# Patient Record
Sex: Male | Born: 1970 | Race: White | Hispanic: No | Marital: Married | Smoking: Never smoker
Health system: Southern US, Community
[De-identification: ages and names within clinical notes are randomized; demographics above are authoritative.]

## PROBLEM LIST (undated history)

## (undated) DIAGNOSIS — I10 Essential (primary) hypertension: Secondary | ICD-10-CM

## (undated) DIAGNOSIS — R7989 Other specified abnormal findings of blood chemistry: Secondary | ICD-10-CM

## (undated) DIAGNOSIS — E291 Testicular hypofunction: Secondary | ICD-10-CM

## (undated) DIAGNOSIS — R7303 Prediabetes: Secondary | ICD-10-CM

## (undated) HISTORY — PX: NASAL SEPTUM SURGERY: SHX37

## (undated) HISTORY — DX: Essential (primary) hypertension: I10

## (undated) HISTORY — DX: Prediabetes: R73.03

## (undated) HISTORY — DX: Testicular hypofunction: E29.1

---

## 2002-03-06 ENCOUNTER — Encounter: Payer: Self-pay | Admitting: Emergency Medicine

## 2002-03-06 ENCOUNTER — Encounter: Payer: Self-pay | Admitting: *Deleted

## 2002-03-06 ENCOUNTER — Emergency Department (HOSPITAL_COMMUNITY): Admission: EM | Admit: 2002-03-06 | Discharge: 2002-03-06 | Payer: Self-pay | Admitting: Emergency Medicine

## 2005-07-09 ENCOUNTER — Emergency Department (HOSPITAL_COMMUNITY): Admission: EM | Admit: 2005-07-09 | Discharge: 2005-07-09 | Payer: Self-pay | Admitting: Emergency Medicine

## 2008-05-02 ENCOUNTER — Emergency Department (HOSPITAL_COMMUNITY): Admission: EM | Admit: 2008-05-02 | Discharge: 2008-05-02 | Payer: Self-pay | Admitting: Emergency Medicine

## 2009-12-24 IMAGING — CT CT ABDOMEN W/O CM
1 of 2 series · 15 of 32 positions shown, 19 images · non-contrast
Comparison: None

CT ABDOMEN

CLINICAL DATA: Right-sided abdominal, pelvic, and flank pain with
microscopic hematuria.

CT ABDOMEN AND PELVIS WITHOUT CONTRAST
TECHNIQUE: Multidetector CT imaging of the abdomen and pelvis was
performed following the standard protocol without intravenous
contrast.

[Series 2: >200 +reformat 5.0 b31f st · axial · 0.62mm/px · z∈[-379,-24]mm · 15 of 77 slices shown, 19 images]
[im 3/77  soft-tissue]
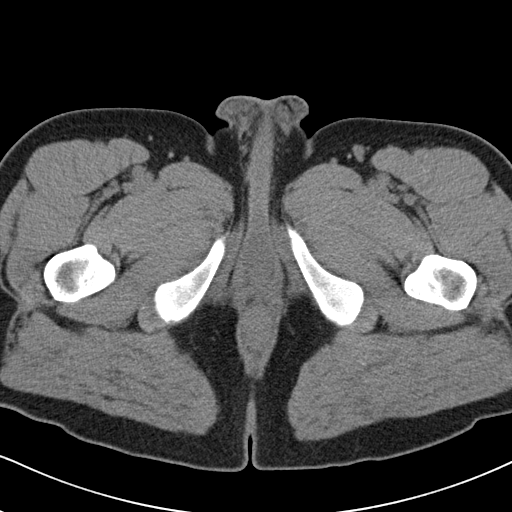
[im 3/77  bone]
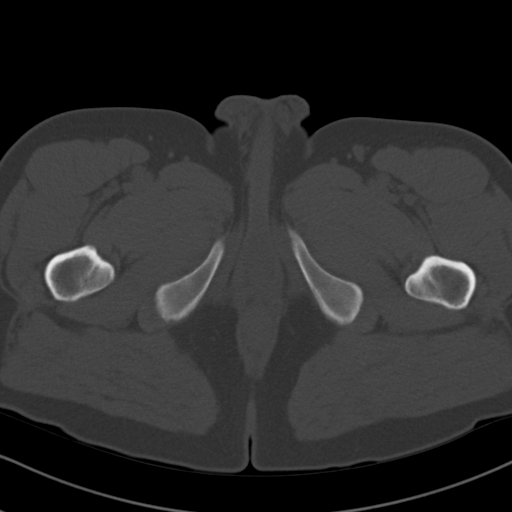
[im 9/77  soft-tissue]
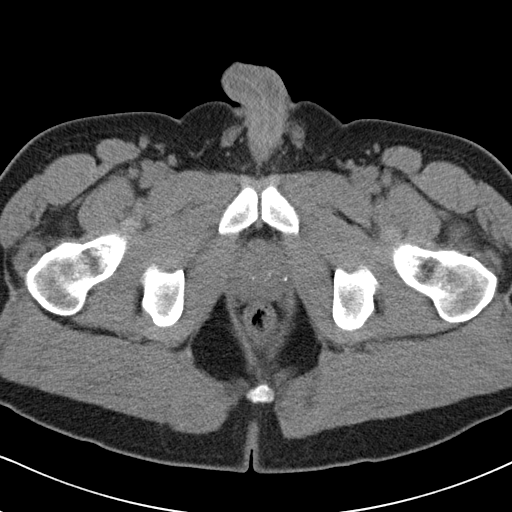
[im 15/77  soft-tissue]
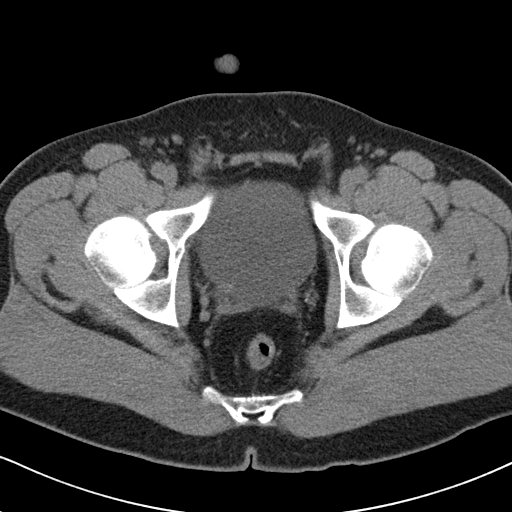
[im 21/77  soft-tissue]
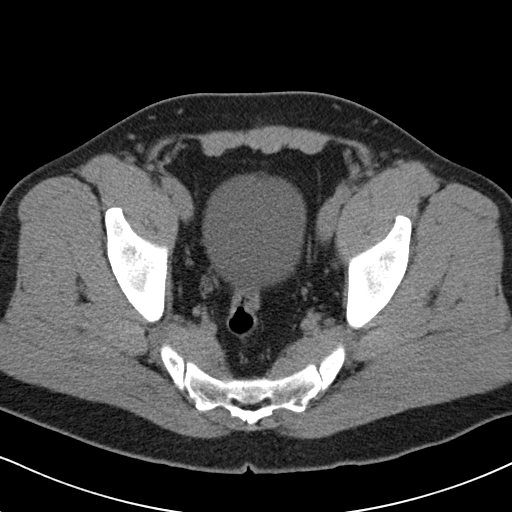
[im 27/77  soft-tissue]
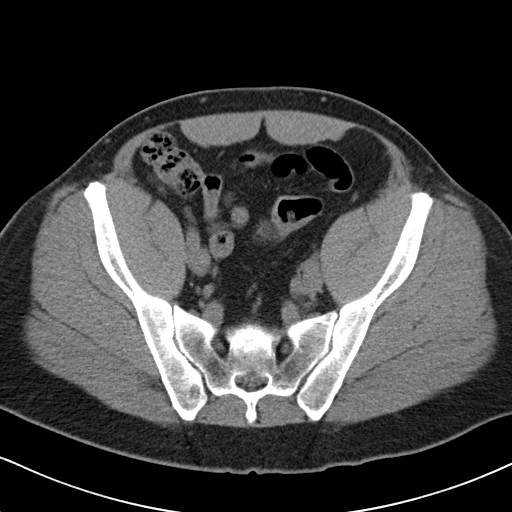
[im 33/77  soft-tissue]
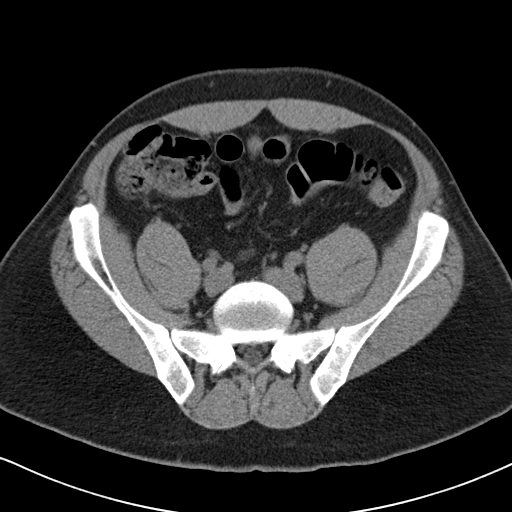
[im 39/77  soft-tissue]
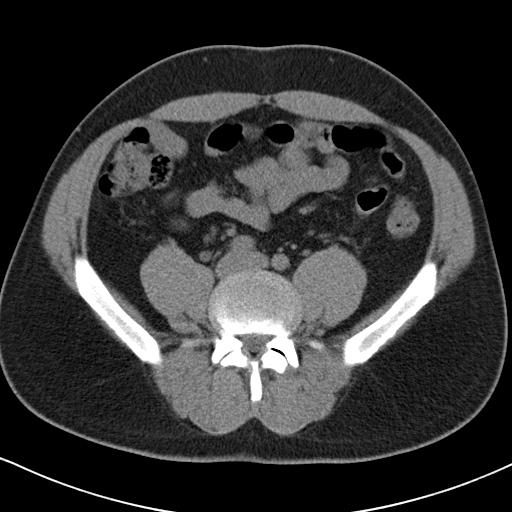
[im 44/77  soft-tissue]
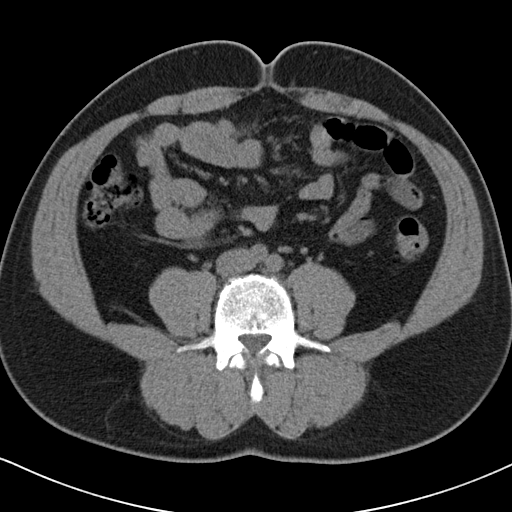
[im 50/77  soft-tissue]
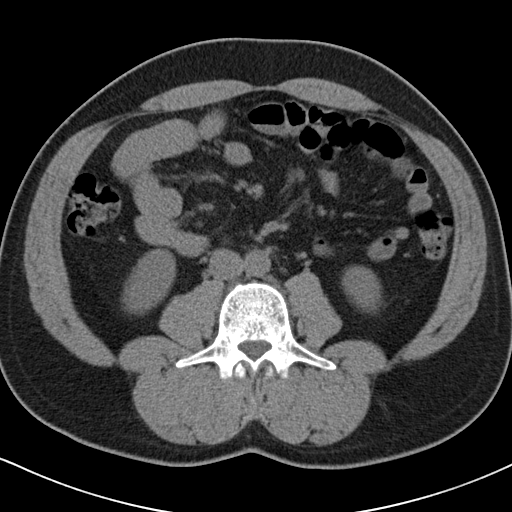
[im 50/77  bone]
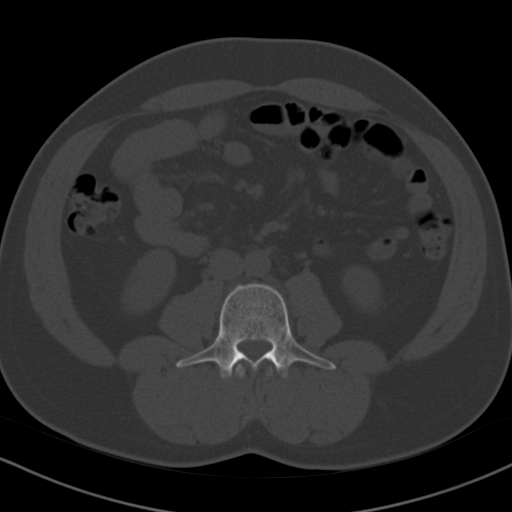
[im 56/77  soft-tissue]
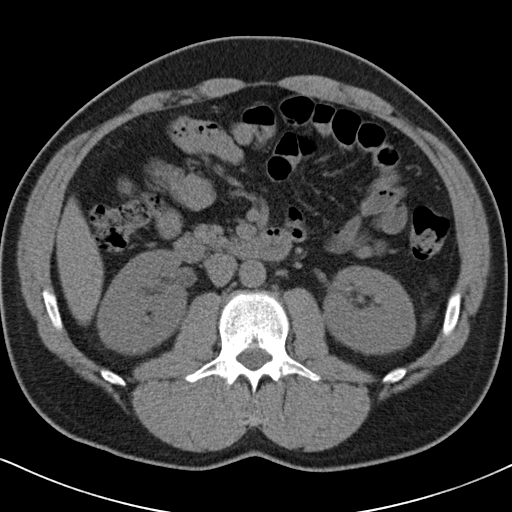
[im 62/77  soft-tissue]
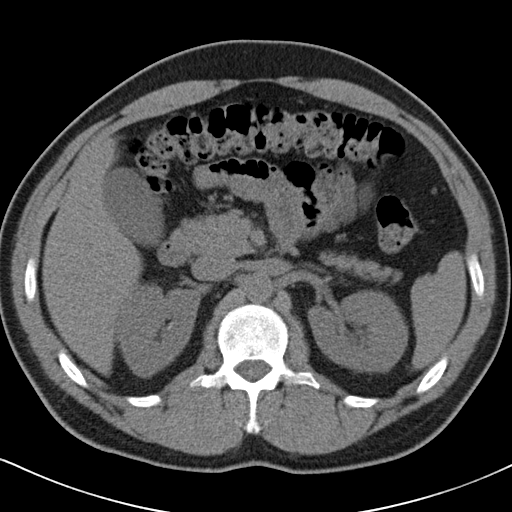
[im 65/77  lung]
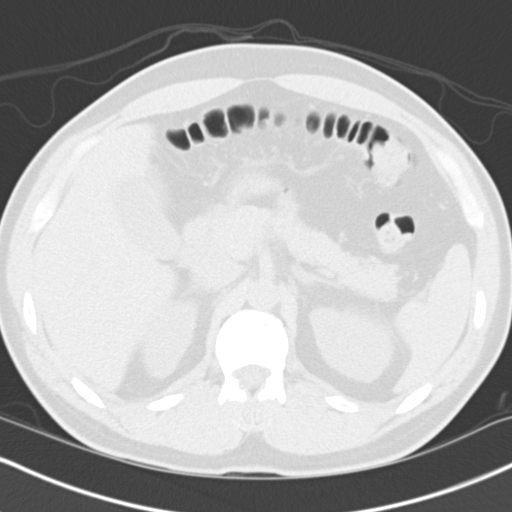
[im 68/77  soft-tissue]
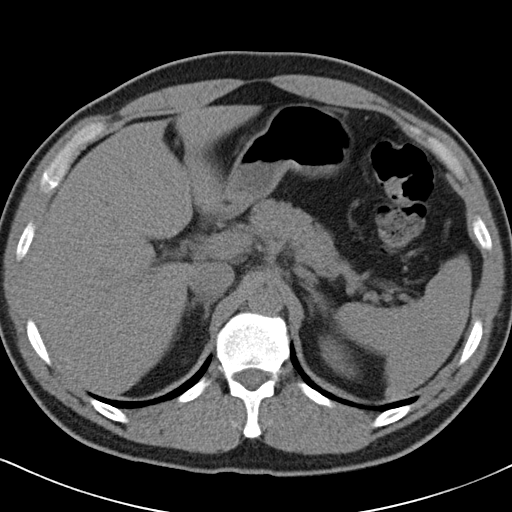
[im 68/77  lung]
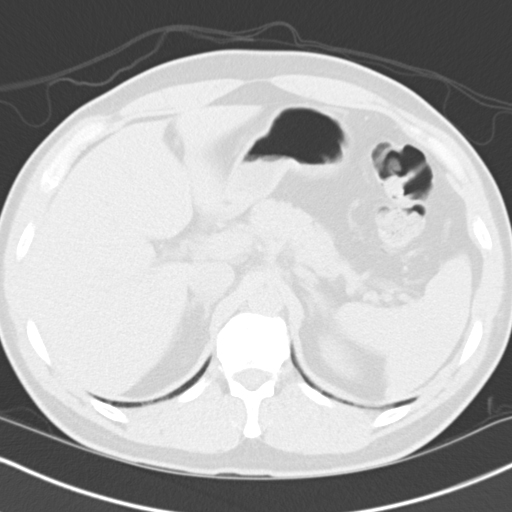
[im 71/77  lung]
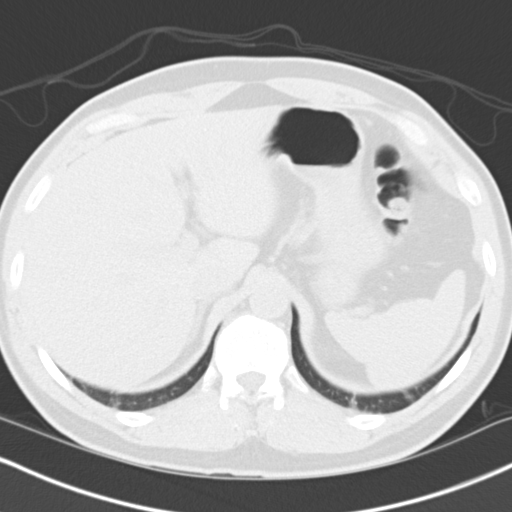
[im 74/77  soft-tissue]
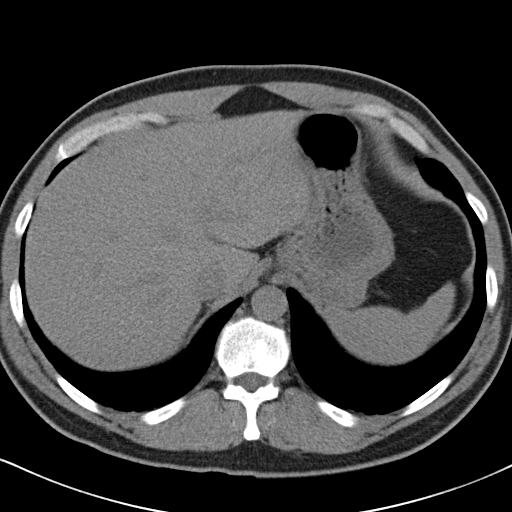
[im 74/77  lung]
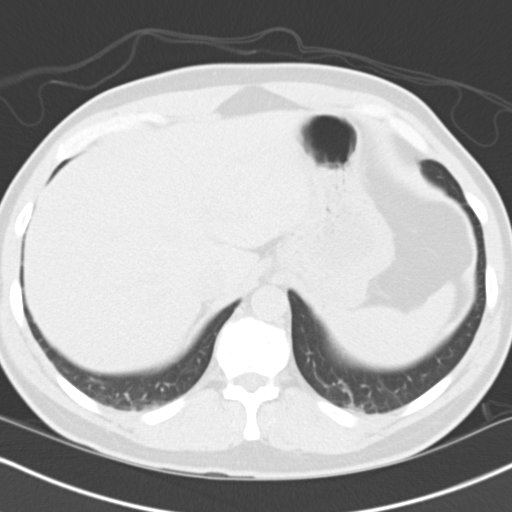

[15 of 32 positions shown; findings below may reference images not displayed]

FINDINGS: The visualized portions of the liver and spleen are
unremarkable.
The pancreas, gallbladder, and adrenal glands are unremarkable.
Nonobstructing bilateral renal calculi are identified, the largest
measuring 4 mm in the mid left kidney. There is no evidence of
hydronephrosis.
Please note that parenchymal abnormalities may be missed as
intravenous contrast was not administered.
No free fluid, enlarged lymph nodes, biliary dilation or abdominal
aortic aneurysm identified.
The visualized bowel is unremarkable.
No acute or suspicious bony abnormalities are identified.
IMPRESSION: No evidence of acute abnormality within the abdomen.

Nonobstructing bilateral renal calculi.

CT PELVIS
FINDINGS: A 2 mm calculus at the right UVJ is identified with mild
fullness of the right ureter.
The bowel and appendix are unremarkable.
There is no evidence of free fluid or enlarged lymph nodes.
No acute or suspicious bony abnormalities are identified.
IMPRESSION: 2 mm right UVJ calculus with mild right ureteral fullness.

## 2010-07-27 LAB — URINALYSIS, ROUTINE W REFLEX MICROSCOPIC
Bilirubin Urine: NEGATIVE
Glucose, UA: NEGATIVE mg/dL
Ketones, ur: 15 mg/dL — AB
Nitrite: NEGATIVE
Protein, ur: NEGATIVE mg/dL
Specific Gravity, Urine: 1.024 (ref 1.005–1.030)
Urobilinogen, UA: 0.2 mg/dL (ref 0.0–1.0)
pH: 7.5 (ref 5.0–8.0)

## 2010-07-27 LAB — URINE MICROSCOPIC-ADD ON

## 2013-04-30 ENCOUNTER — Other Ambulatory Visit: Payer: BC Managed Care – PPO

## 2013-04-30 DIAGNOSIS — Z0000 Encounter for general adult medical examination without abnormal findings: Secondary | ICD-10-CM

## 2013-04-30 LAB — CBC WITH DIFFERENTIAL/PLATELET
BASOS PCT: 0 % (ref 0–1)
Basophils Absolute: 0 10*3/uL (ref 0.0–0.1)
EOS ABS: 0.1 10*3/uL (ref 0.0–0.7)
Eosinophils Relative: 2 % (ref 0–5)
HCT: 43.8 % (ref 39.0–52.0)
Hemoglobin: 15 g/dL (ref 13.0–17.0)
Lymphocytes Relative: 30 % (ref 12–46)
Lymphs Abs: 2.2 10*3/uL (ref 0.7–4.0)
MCH: 30.2 pg (ref 26.0–34.0)
MCHC: 34.2 g/dL (ref 30.0–36.0)
MCV: 88.3 fL (ref 78.0–100.0)
Monocytes Absolute: 0.7 10*3/uL (ref 0.1–1.0)
Monocytes Relative: 10 % (ref 3–12)
NEUTROS ABS: 4.3 10*3/uL (ref 1.7–7.7)
NEUTROS PCT: 58 % (ref 43–77)
PLATELETS: 268 10*3/uL (ref 150–400)
RBC: 4.96 MIL/uL (ref 4.22–5.81)
RDW: 13.5 % (ref 11.5–15.5)
WBC: 7.5 10*3/uL (ref 4.0–10.5)

## 2013-04-30 LAB — COMPREHENSIVE METABOLIC PANEL
ALBUMIN: 4.2 g/dL (ref 3.5–5.2)
ALK PHOS: 65 U/L (ref 39–117)
ALT: 14 U/L (ref 0–53)
AST: 15 U/L (ref 0–37)
BILIRUBIN TOTAL: 0.5 mg/dL (ref 0.3–1.2)
BUN: 18 mg/dL (ref 6–23)
CO2: 26 mEq/L (ref 19–32)
Calcium: 9.3 mg/dL (ref 8.4–10.5)
Chloride: 105 mEq/L (ref 96–112)
Creat: 0.83 mg/dL (ref 0.50–1.35)
Glucose, Bld: 112 mg/dL — ABNORMAL HIGH (ref 70–99)
POTASSIUM: 4.6 meq/L (ref 3.5–5.3)
SODIUM: 141 meq/L (ref 135–145)
TOTAL PROTEIN: 6.6 g/dL (ref 6.0–8.3)

## 2013-04-30 LAB — LIPID PANEL
Cholesterol: 175 mg/dL (ref 0–200)
HDL: 47 mg/dL (ref >39)
LDL CALC: 111 mg/dL — AB (ref 0–99)
TRIGLYCERIDES: 85 mg/dL (ref <150)
Total CHOL/HDL Ratio: 3.7 Ratio
VLDL: 17 mg/dL (ref 0–40)

## 2013-05-01 ENCOUNTER — Encounter: Payer: Self-pay | Admitting: Family Medicine

## 2013-05-01 ENCOUNTER — Ambulatory Visit (INDEPENDENT_AMBULATORY_CARE_PROVIDER_SITE_OTHER): Payer: BC Managed Care – PPO | Admitting: Family Medicine

## 2013-05-01 VITALS — BP 130/88 | HR 72 | Temp 98.5°F | Resp 16 | Ht 65.0 in | Wt 183.0 lb

## 2013-05-01 DIAGNOSIS — R5383 Other fatigue: Secondary | ICD-10-CM

## 2013-05-01 DIAGNOSIS — R7303 Prediabetes: Secondary | ICD-10-CM | POA: Insufficient documentation

## 2013-05-01 DIAGNOSIS — R5381 Other malaise: Secondary | ICD-10-CM

## 2013-05-01 DIAGNOSIS — Z0000 Encounter for general adult medical examination without abnormal findings: Secondary | ICD-10-CM

## 2013-05-01 NOTE — Progress Notes (Signed)
Subjective:    Patient ID: Nathaniel Parrish, male    DOB: [birth year], 43 y.o.   MRN: [id number]  HPI Patient is here today for complete physical exam. He is also concerned because he is developed increasing daily fatigue. He denies any depression or anhedonia.  However it he has daily malaise, poor energy, low libido, erectile dysfunction. He is concerned he may have low testosterone. He came earlier for his lab work and it is listed below: Lab on 04/30/2013  Component Date Value Range Status  . WBC 04/30/2013 7.5  4.0 - 10.5 K/uL Final  . RBC 04/30/2013 4.96  4.22 - 5.81 MIL/uL Final  . Hemoglobin 04/30/2013 15.0  13.0 - 17.0 g/dL Final  . HCT 04/30/2013 43.8  39.0 - 52.0 % Final  . MCV 04/30/2013 88.3  78.0 - 100.0 fL Final  . MCH 04/30/2013 30.2  26.0 - 34.0 pg Final  . MCHC 04/30/2013 34.2  30.0 - 36.0 g/dL Final  . RDW 04/30/2013 13.5  11.5 - 15.5 % Final  . Platelets 04/30/2013 268  150 - 400 K/uL Final  . Neutrophils Relative % 04/30/2013 58  43 - 77 % Final  . Neutro Abs 04/30/2013 4.3  1.7 - 7.7 K/uL Final  . Lymphocytes Relative 04/30/2013 30  12 - 46 % Final  . Lymphs Abs 04/30/2013 2.2  0.7 - 4.0 K/uL Final  . Monocytes Relative 04/30/2013 10  3 - 12 % Final  . Monocytes Absolute 04/30/2013 0.7  0.1 - 1.0 K/uL Final  . Eosinophils Relative 04/30/2013 2  0 - 5 % Final  . Eosinophils Absolute 04/30/2013 0.1  0.0 - 0.7 K/uL Final  . Basophils Relative 04/30/2013 0  0 - 1 % Final  . Basophils Absolute 04/30/2013 0.0  0.0 - 0.1 K/uL Final  . Smear Review 04/30/2013 Criteria for review not met   Final  . Sodium 04/30/2013 141  135 - 145 mEq/L Final  . Potassium 04/30/2013 4.6  3.5 - 5.3 mEq/L Final  . Chloride 04/30/2013 105  96 - 112 mEq/L Final  . CO2 04/30/2013 26  19 - 32 mEq/L Final  . Glucose, Bld 04/30/2013 112* 70 - 99 mg/dL Final  . BUN 04/30/2013 18  6 - 23 mg/dL Final  . Creat 04/30/2013 0.83  0.50 - 1.35 mg/dL Final  . Total Bilirubin 04/30/2013 0.5  0.3 - 1.2  mg/dL Final  . Alkaline Phosphatase 04/30/2013 65  39 - 117 U/L Final  . AST 04/30/2013 15  0 - 37 U/L Final  . ALT 04/30/2013 14  0 - 53 U/L Final  . Total Protein 04/30/2013 6.6  6.0 - 8.3 g/dL Final  . Albumin 04/30/2013 4.2  3.5 - 5.2 g/dL Final  . Calcium 04/30/2013 9.3  8.4 - 10.5 mg/dL Final  . Cholesterol 04/30/2013 175  0 - 200 mg/dL Final   Comment: ATP III Classification:                                < 200        mg/dL        Desirable                               200 - 239     mg/dL        Borderline High                               >=  240        mg/dL        High                             . Triglycerides 04/30/2013 85  <150 mg/dL Final  . HDL 04/30/2013 47  >39 mg/dL Final  . Total CHOL/HDL Ratio 04/30/2013 3.7   Final  . VLDL 04/30/2013 17  0 - 40 mg/dL Final  . LDL Cholesterol 04/30/2013 111* 0 - 99 mg/dL Final   Comment:                            Total Cholesterol/HDL Ratio:CHD Risk                                                 Coronary Heart Disease Risk Table                                                                 Men       Women                                   1/2 Average Risk              3.4        3.3                                       Average Risk              5.0        4.4                                    2X Average Risk              9.6        7.1                                    3X Average Risk             23.4       11.0                          Use the calculated Patient Ratio above and the CHD Risk table                           to determine the patient's CHD Risk.                          ATP III Classification (LDL):                                <  100        mg/dL         Optimal                               100 - 129     mg/dL         Near or Above Optimal                               130 - 159     mg/dL         Borderline High                               160 - 189     mg/dL         High                                 > 190        mg/dL         Very High                              Labs were significant for a fasting blood sugar of 112 otherwise normal.. History reviewed. No pertinent past medical history. Past Surgical History  Procedure Laterality Date  . Nasal septum surgery     No current outpatient prescriptions on file prior to visit.   No current facility-administered medications on file prior to visit.   No Known Allergies History   Social History  . Marital Status: Single    Spouse Name: N/A    Number of Children: N/A  . Years of Education: N/A   Occupational History  . Not on file.   Social History Main Topics  . Smoking status: Never Smoker   . Smokeless tobacco: Not on file  . Alcohol Use: No  . Drug Use: No  . Sexual Activity: Yes     Comment: married   Other Topics Concern  . Not on file   Social History Narrative  . No narrative on file   Family History  Problem Relation Age of Onset  . Hypertension Mother   . Heart disease Father     mi at 8, cabg at 51  . Heart disease Paternal Aunt     mi at 16  . Heart disease Paternal Uncle       Review of Systems  All other systems reviewed and are negative.       Objective:   Physical Exam  Vitals reviewed. Constitutional: He is oriented to person, place, and time. He appears well-developed and well-nourished. No distress.  HENT:  Head: Normocephalic and atraumatic.  Right Ear: External ear normal.  Left Ear: External ear normal.  Nose: Nose normal.  Mouth/Throat: Oropharynx is clear and moist. No oropharyngeal exudate.  Eyes: Conjunctivae and EOM are normal. Pupils are equal, round, and reactive to light. Right eye exhibits no discharge. Left eye exhibits no discharge. No scleral icterus.  Neck: Normal range of motion. Neck supple. No JVD present. No tracheal deviation present. No thyromegaly present.  Cardiovascular: Normal rate, regular rhythm and normal heart sounds.  Exam reveals no gallop and no  friction rub.  No murmur heard. Pulmonary/Chest: Effort normal and breath sounds normal. No stridor. No respiratory distress. He has no wheezes. He has no rales. He exhibits no tenderness.  Abdominal: Soft. Bowel sounds are normal. He exhibits no distension and no mass. There is no tenderness. There is no rebound and no guarding.  Genitourinary: Penis normal.  Musculoskeletal: Normal range of motion. He exhibits no edema and no tenderness.  Lymphadenopathy:    He has no cervical adenopathy.  Neurological: He is alert and oriented to person, place, and time. He has normal reflexes. He displays normal reflexes. No cranial nerve deficit. He exhibits normal muscle tone. Coordination normal.  Skin: Skin is warm. No rash noted. He is not diaphoretic. No erythema. No pallor.  Psychiatric: He has a normal mood and affect. His behavior is normal. Judgment and thought content normal.   patient has slight atrophy of the left testicle.        Assessment & Plan:  1. Routine general medical examination at a health care facility Patient's physical exam is completely normal. He declined a flu shot. He declined a tetanus shot. He is not due yet for a PSA or colonoscopy. The remainder of his exam is reassuring. Did spend time discussing a low carbohydrate diet to address his prediabetes. I recommended that we recheck his fasting blood sugar and possibly 6 months. Also recommended that he increase aerobic exercise to 30 minutes a day 5 days a week.  2. Other malaise and fatigue Return for a testosterone as well as a TSH. - TSH; Future - Testosterone; Future

## 2013-05-04 ENCOUNTER — Other Ambulatory Visit: Payer: BC Managed Care – PPO

## 2013-05-04 DIAGNOSIS — R5381 Other malaise: Secondary | ICD-10-CM

## 2013-05-04 DIAGNOSIS — R5383 Other fatigue: Principal | ICD-10-CM

## 2013-05-04 LAB — TESTOSTERONE: TESTOSTERONE: 197 ng/dL — AB (ref 300–890)

## 2013-05-04 LAB — TSH: TSH: 0.96 u[IU]/mL (ref 0.350–4.500)

## 2013-05-09 ENCOUNTER — Other Ambulatory Visit: Payer: Self-pay | Admitting: Family Medicine

## 2013-05-09 DIAGNOSIS — R7989 Other specified abnormal findings of blood chemistry: Secondary | ICD-10-CM

## 2013-05-09 MED ORDER — TESTOSTERONE CYPIONATE 200 MG/ML IM SOLN: 200.0000 mg | INTRAMUSCULAR | Status: DC

## 2013-05-11 ENCOUNTER — Telehealth: Payer: Self-pay | Admitting: Family Medicine

## 2013-05-11 ENCOUNTER — Other Ambulatory Visit: Payer: BC Managed Care – PPO

## 2013-05-11 DIAGNOSIS — R7989 Other specified abnormal findings of blood chemistry: Secondary | ICD-10-CM

## 2013-05-11 LAB — PROLACTIN: Prolactin: 10 ng/mL (ref 2.1–17.1)

## 2013-05-11 LAB — FSH/LH
FSH: 3.2 m[IU]/mL (ref 1.4–18.1)
LH: 3.9 m[IU]/mL (ref 1.5–9.3)

## 2013-05-11 LAB — TESTOSTERONE: Testosterone: 241 ng/dL — ABNORMAL LOW (ref 300–890)

## 2013-05-11 NOTE — Telephone Encounter (Signed)
rec'd request from pharmacy form PA for testosterone injections.  PA has been submitted thru Cover My Meds.  Lab results have been faxed to them at number given [phone]  [facility] # [place]  Awaiting response

## 2013-05-14 ENCOUNTER — Ambulatory Visit: Payer: BC Managed Care – PPO | Admitting: *Deleted

## 2013-05-14 DIAGNOSIS — R7989 Other specified abnormal findings of blood chemistry: Secondary | ICD-10-CM

## 2013-05-14 MED ORDER — TESTOSTERONE CYPIONATE 200 MG/ML IM SOLN: 200.0000 mg | INTRAMUSCULAR | Status: DC

## 2013-05-15 NOTE — Telephone Encounter (Signed)
Rec'd approval for Testosterone injection from [facility] [place]  For dates 05/11/2013 - 04/11/2038 Reference number [id number] Approval faxed to pharmacy

## 2013-05-21 ENCOUNTER — Telehealth: Payer: Self-pay | Admitting: Family Medicine

## 2013-05-21 NOTE — Telephone Encounter (Signed)
Pt aware of all blood work results.

## 2013-12-03 ENCOUNTER — Encounter: Payer: Self-pay | Admitting: Family Medicine

## 2013-12-03 ENCOUNTER — Ambulatory Visit (INDEPENDENT_AMBULATORY_CARE_PROVIDER_SITE_OTHER): Payer: BC Managed Care – PPO | Admitting: Family Medicine

## 2013-12-03 VITALS — BP 142/106 | HR 84 | Temp 98.4°F | Resp 20 | Ht 65.0 in | Wt 191.0 lb

## 2013-12-03 DIAGNOSIS — E291 Testicular hypofunction: Secondary | ICD-10-CM

## 2013-12-03 NOTE — Progress Notes (Signed)
Subjective:    Patient ID: Nathaniel Parrish, male    DOB: [birth year], 43 y.o.   MRN: [id number]  HPI 05/01/13 Patient is here today for complete physical exam. He is also concerned because he is developed increasing daily fatigue. He denies any depression or anhedonia.  However it he has daily malaise, poor energy, low libido, erectile dysfunction. He is concerned he may have low testosterone. He came earlier for his lab work and it is listed below: No visits with results within 1 Week(s) from this visit. Latest known visit with results is:  Appointment on 05/11/2013  Component Date Value Ref Range Status  . [place] 05/11/2013 3.2  1.4 - 18.1 mIU/mL Final   Comment: Reference Ranges:                                   Male:                         1.4 -  18.1 mIU/mL                                   Male:   Follicular Phase    2.5 -  10.2 mIU/mL                                             MidCycle Peak       3.4 -  33.4 mIU/mL                                             Luteal Phase        1.5 -   9.1 mIU/mL                                             Post Menopausal    23.0 - 116.3 mIU/mL                                             Pregnant                <   0.3 mIU/mL  . LH 05/11/2013 3.9  1.5 - 9.3 mIU/mL Final   Comment: Reference Ranges:                                   Male:     20 - 70 Years           1.5 -  9.3 mIU/mL                                                > 70 Years  3.1 - 34.6 mIU/mL                                   Male:   Follicular Phase        1.9 - 12.5 mIU/mL                                             Midcycle                8.7 - 76.3 mIU/mL                                             Luteal Phase            0.5 - 16.9 mIU/mL                                             Post Menopausal        15.9 - 54.0 mIU/mL                                             Pregnant                    <  1.5 mIU/mL   Contraceptives          0.7 -  5.6 mIU/mL                                   Children:                             <  6.0 mIU/mL                             . Testosterone 05/11/2013 241* 300 - 890 ng/dL Final   Comment:           [place] [facility]       Male              Male                                        I              < 30 ng/dL        < 10 ng/dL                                        II             < 150 ng/dL       < 30 ng/dL  III            100-320 ng/dL     < 35 ng/dL                                        IV             200-970 ng/dL     15-40 ng/dL                                        V/Adult        300-890 ng/dL     10-70 ng/dL                             . Prolactin 05/11/2013 10.0  2.1 - 17.1 ng/mL Final   Comment:      Reference Ranges:                                           Male:                       2.1 -  17.1 ng/ml                                           Male:   Pregnant          9.7 - 208.5 ng/mL                                                     Non Pregnant      2.8 -  29.2 ng/mL                                                     Post Menopausal   1.8 -  20.3 ng/mL                                               12/03/13 Due to low testosterone, the patient was placed on testosterone cypionate 200 mg IM every 2 weeks. The first 3 months after starting the medication his fatigue, low the day of, and rectal problems from acne improved. Over the last 3 months he is not seen quite as much benefit. He denies any side effects on medication. If his blood pressure today is extremely elevated at 142/106. Past Medical History  Diagnosis Date  . Prediabetes    Past Surgical History  Procedure Laterality Date  . Nasal septum surgery     Current Outpatient Prescriptions on File Prior to Visit  Medication Sig Dispense Refill  . testosterone cypionate (DEPO-TESTOSTERONE) 200 MG/ML  injection Inject 1 mL (200 mg total) into the  muscle every 14 (fourteen) days.  10 mL  3   No current facility-administered medications on file prior to visit.   No Known Allergies History   Social History  . Marital Status: Single    Spouse Name: N/A    Number of Children: N/A  . Years of Education: N/A   Occupational History  . Not on file.   Social History Main Topics  . Smoking status: Never Smoker   . Smokeless tobacco: Not on file  . Alcohol Use: No  . Drug Use: No  . Sexual Activity: Yes     Comment: married   Other Topics Concern  . Not on file   Social History Narrative  . No narrative on file   Family History  Problem Relation Age of Onset  . Hypertension Mother   . Heart disease Father     mi at 31, cabg at 85  . Heart disease Paternal Aunt     mi at 89  . Heart disease Paternal Uncle       Review of Systems  All other systems reviewed and are negative.      Objective:   Physical Exam  Vitals reviewed. Constitutional: He is oriented to person, place, and time. He appears well-developed and well-nourished. No distress.  HENT:  Head: Normocephalic and atraumatic.  Right Ear: External ear normal.  Left Ear: External ear normal.  Nose: Nose normal.  Mouth/Throat: Oropharynx is clear and moist. No oropharyngeal exudate.  Eyes: Conjunctivae and EOM are normal. Pupils are equal, round, and reactive to light. Right eye exhibits no discharge. Left eye exhibits no discharge. No scleral icterus.  Neck: Normal range of motion. Neck supple. No JVD present. No tracheal deviation present. No thyromegaly present.  Cardiovascular: Normal rate, regular rhythm and normal heart sounds.  Exam reveals no gallop and no friction rub.   No murmur heard. Pulmonary/Chest: Effort normal and breath sounds normal. No stridor. No respiratory distress. He has no wheezes. He has no rales. He exhibits no tenderness.  Abdominal: Soft. Bowel sounds are normal. He exhibits no distension and no mass. There is no tenderness.  There is no rebound and no guarding.  Genitourinary: Penis normal.  Musculoskeletal: Normal range of motion. He exhibits no edema and no tenderness.  Lymphadenopathy:    He has no cervical adenopathy.  Neurological: He is alert and oriented to person, place, and time. He has normal reflexes. No cranial nerve deficit. He exhibits normal muscle tone. Coordination normal.  Skin: Skin is warm. No rash noted. He is not diaphoretic. No erythema. No pallor.  Psychiatric: He has a normal mood and affect. His behavior is normal. Judgment and thought content normal.   patient has slight atrophy of the left testicle.        Assessment & Plan:  Hypogonadism in male - Plan: PSA, Testosterone, CBC with Differential  Patient symptomatically has improved on testosterone. I will check a PSA today, CBC, and testosterone level. Labwork is normal he elects to continue this medication and continue to follow every 6 months surveillance.  Regarding his blood pressure, the patient like to check his blood pressure daily for the next week and reports me that he is. If consistently greater than 140/90, start patient on antihypertensive meds.

## 2013-12-04 LAB — CBC WITH DIFFERENTIAL/PLATELET
Basophils Absolute: 0 10*3/uL (ref 0.0–0.1)
Basophils Relative: 0 % (ref 0–1)
EOS ABS: 0.2 10*3/uL (ref 0.0–0.7)
Eosinophils Relative: 2 % (ref 0–5)
HEMATOCRIT: 47.5 % (ref 39.0–52.0)
HEMOGLOBIN: 16.5 g/dL (ref 13.0–17.0)
LYMPHS ABS: 2.6 10*3/uL (ref 0.7–4.0)
Lymphocytes Relative: 29 % (ref 12–46)
MCH: 30.1 pg (ref 26.0–34.0)
MCHC: 34.7 g/dL (ref 30.0–36.0)
MCV: 86.5 fL (ref 78.0–100.0)
MONO ABS: 1 10*3/uL (ref 0.1–1.0)
MONOS PCT: 11 % (ref 3–12)
NEUTROS PCT: 58 % (ref 43–77)
Neutro Abs: 5.1 10*3/uL (ref 1.7–7.7)
Platelets: 289 10*3/uL (ref 150–400)
RBC: 5.49 MIL/uL (ref 4.22–5.81)
RDW: 14 % (ref 11.5–15.5)
WBC: 8.8 10*3/uL (ref 4.0–10.5)

## 2013-12-04 LAB — PSA: PSA: 0.64 ng/mL (ref <=4.00)

## 2013-12-04 LAB — TESTOSTERONE: Testosterone: 936 ng/dL — ABNORMAL HIGH (ref 300–890)

## 2013-12-27 ENCOUNTER — Ambulatory Visit (INDEPENDENT_AMBULATORY_CARE_PROVIDER_SITE_OTHER): Payer: BC Managed Care – PPO | Admitting: Family Medicine

## 2013-12-27 ENCOUNTER — Encounter: Payer: Self-pay | Admitting: Family Medicine

## 2013-12-27 VITALS — BP 150/98 | HR 68 | Temp 98.3°F | Resp 16 | Ht 65.0 in | Wt 188.0 lb

## 2013-12-27 DIAGNOSIS — I10 Essential (primary) hypertension: Secondary | ICD-10-CM

## 2013-12-27 DIAGNOSIS — E291 Testicular hypofunction: Secondary | ICD-10-CM | POA: Insufficient documentation

## 2013-12-27 MED ORDER — TESTOSTERONE CYPIONATE 200 MG/ML IM SOLN: 200.0000 mg | INTRAMUSCULAR | Status: DC

## 2013-12-27 MED ORDER — LOSARTAN POTASSIUM-HCTZ 50-12.5 MG PO TABS: 1.0000 | ORAL_TABLET | Freq: Every day | ORAL | Status: DC

## 2013-12-27 NOTE — Progress Notes (Signed)
   Subjective:    Patient ID: Nathaniel Parrish, male    DOB: 06-Jun-1970, 43 y.o.   MRN: [id number]  HPI  Patient's blood pressure has been consistently running high 150-170/90-100. He denies any chest pain shortness of breath or dyspnea on exertion. He has a significant family history of coronary artery disease in his father. Past Medical History  Diagnosis Date  . Prediabetes    Past Surgical History  Procedure Laterality Date  . Nasal septum surgery     No current outpatient prescriptions on file prior to visit.   No current facility-administered medications on file prior to visit.   No Known Allergies History   Social History  . Marital Status: Single    Spouse Name: N/A    Number of Children: N/A  . Years of Education: N/A   Occupational History  . Not on file.   Social History Main Topics  . Smoking status: Never Smoker   . Smokeless tobacco: Not on file  . Alcohol Use: No  . Drug Use: No  . Sexual Activity: Yes     Comment: married   Other Topics Concern  . Not on file   Social History Narrative  . No narrative on file     Review of Systems  All other systems reviewed and are negative.      Objective:   Physical Exam  Vitals reviewed. Cardiovascular: Normal rate, regular rhythm and normal heart sounds.   No murmur heard. Pulmonary/Chest: Effort normal and breath sounds normal. No respiratory distress. He has no wheezes. He has no rales.  Abdominal: Soft. Bowel sounds are normal.  Musculoskeletal: He exhibits no edema.          Assessment & Plan:  Essential hypertension - Plan: losartan-hydrochlorothiazide (HYZAAR) 50-12.5 MG per tablet   Begin Hyzaar 50/12.5 one by mouth daily and recheck blood pressure in 2 weeks.

## 2014-01-22 ENCOUNTER — Telehealth: Payer: Self-pay | Admitting: Family Medicine

## 2014-01-22 MED ORDER — LOSARTAN POTASSIUM-HCTZ 100-25 MG PO TABS: 1.0000 | ORAL_TABLET | Freq: Every day | ORAL | Status: DC

## 2014-01-22 NOTE — Telephone Encounter (Signed)
LMTRC

## 2014-01-22 NOTE — Telephone Encounter (Signed)
[phone] or [phone]  Pt is needing to speak to you about the BP medication, it has brought it down some and its not what it should be

## 2014-01-22 NOTE — Telephone Encounter (Signed)
Pt aware and med sent to pharm 

## 2014-01-22 NOTE — Telephone Encounter (Signed)
Increase Hyzaar to 100/25 by mouth daily

## 2014-01-22 NOTE — Telephone Encounter (Signed)
Pt has been checking his BP and the 1st week he had an average of 120-130/mid 80's but the last week or so it has come back up with an average of 130-140/mid 90's. He has bee on the medication for about 3 weeks. What do you want him to do about BP?

## 2014-01-26 ENCOUNTER — Emergency Department (HOSPITAL_COMMUNITY): Payer: BC Managed Care – PPO

## 2014-01-26 ENCOUNTER — Encounter (HOSPITAL_COMMUNITY): Payer: Self-pay | Admitting: Emergency Medicine

## 2014-01-26 ENCOUNTER — Emergency Department (HOSPITAL_COMMUNITY)
Admission: EM | Admit: 2014-01-26 | Discharge: 2014-01-26 | Disposition: A | Discharge status: Home / Self Care | Payer: BC Managed Care – PPO | Attending: Emergency Medicine | Admitting: Emergency Medicine

## 2014-01-26 DIAGNOSIS — R0602 Shortness of breath: Secondary | ICD-10-CM | POA: Diagnosis not present

## 2014-01-26 DIAGNOSIS — I10 Essential (primary) hypertension: Secondary | ICD-10-CM | POA: Diagnosis not present

## 2014-01-26 DIAGNOSIS — Z8639 Personal history of other endocrine, nutritional and metabolic disease: Secondary | ICD-10-CM | POA: Diagnosis not present

## 2014-01-26 DIAGNOSIS — M25512 Pain in left shoulder: Secondary | ICD-10-CM | POA: Insufficient documentation

## 2014-01-26 DIAGNOSIS — M542 Cervicalgia: Secondary | ICD-10-CM | POA: Insufficient documentation

## 2014-01-26 HISTORY — DX: Other specified abnormal findings of blood chemistry: R79.89

## 2014-01-26 LAB — BASIC METABOLIC PANEL
Anion gap: 12 (ref 5–15)
BUN: 12 mg/dL (ref 6–23)
CALCIUM: 9.3 mg/dL (ref 8.4–10.5)
CO2: 27 meq/L (ref 19–32)
CREATININE: 0.89 mg/dL (ref 0.50–1.35)
Chloride: 98 mEq/L (ref 96–112)
GFR calc Af Amer: >90 mL/min (ref >90)
GFR calc non Af Amer: >90 mL/min (ref >90)
GLUCOSE: 110 mg/dL — AB (ref 70–99)
Potassium: 3.6 mEq/L — ABNORMAL LOW (ref 3.7–5.3)
Sodium: 137 mEq/L (ref 137–147)

## 2014-01-26 LAB — CBC WITH DIFFERENTIAL/PLATELET
Basophils Absolute: 0 10*3/uL (ref 0.0–0.1)
Basophils Relative: 0 % (ref 0–1)
EOS PCT: 1 % (ref 0–5)
Eosinophils Absolute: 0.1 10*3/uL (ref 0.0–0.7)
HEMATOCRIT: 46.6 % (ref 39.0–52.0)
Hemoglobin: 15.9 g/dL (ref 13.0–17.0)
LYMPHS ABS: 1.2 10*3/uL (ref 0.7–4.0)
LYMPHS PCT: 18 % (ref 12–46)
MCH: 29.4 pg (ref 26.0–34.0)
MCHC: 34.1 g/dL (ref 30.0–36.0)
MCV: 86.1 fL (ref 78.0–100.0)
MONO ABS: 0.8 10*3/uL (ref 0.1–1.0)
Monocytes Relative: 11 % (ref 3–12)
Neutro Abs: 4.9 10*3/uL (ref 1.7–7.7)
Neutrophils Relative %: 70 % (ref 43–77)
Platelets: 223 10*3/uL (ref 150–400)
RBC: 5.41 MIL/uL (ref 4.22–5.81)
RDW: 12.4 % (ref 11.5–15.5)
WBC: 7 10*3/uL (ref 4.0–10.5)

## 2014-01-26 LAB — TROPONIN I: Troponin I: <0.3 ng/mL (ref <0.30)

## 2014-01-26 NOTE — Discharge Instructions (Signed)

## 2014-01-26 NOTE — ED Notes (Signed)
Pt from home with c/o left sided neck pain radiating to left shoulder blade/"roatator cuff" for several days.  Pt reports taking ibuprofin, using a heating pad, and some arm positioning with some relief.  Pt additionally states he has become short of breath starting this am which relates to the increase of pain today.  Pt in NAD, A&O.

## 2014-01-26 NOTE — ED Provider Notes (Signed)
CSN: [id number]     Arrival date & time 01/26/14  4742 History   First MD Initiated Contact with Patient 01/26/14 [phone]     Chief Complaint  Patient presents with  . Neck Pain  . Shoulder Pain  . Shortness of Breath     (Consider location/radiation/quality/duration/timing/severity/associated sxs/prior Treatment) HPI Comments: 43 yo male with hx of HTN presenting with left shoulder pain.  He started having neck pain (described as a crick in his neck) a few days ago.  Motrin has helped this.  He was able to play golf yesterday, but had pain in his left neck and shoulder afterwards.  This morning, awoke to sharp, severe, left shoulder pain.    No nausea, vomiting, chest pain, shortness of breath, diaphoresis, or light headedness.    Patient is a 43 y.o. male presenting with shoulder pain.  Shoulder Pain This is a new problem. Episode onset: yesterday, worse today. Episode frequency: intermittent. The problem has not changed since onset.Pertinent negatives include no chest pain, no abdominal pain and no shortness of breath (pain takes his breath away). Nothing aggravates the symptoms. Relieved by: certain movements, passive ROM, ibuprofen. Treatments tried: NSAIDs. The treatment provided mild relief.    Past Medical History  Diagnosis Date  . Prediabetes   . Hypertension   . Hypogonadism in male   . Low testosterone    Past Surgical History  Procedure Laterality Date  . Nasal septum surgery     Family History  Problem Relation Age of Onset  . Hypertension Mother   . Heart disease Father     mi at 35, cabg at 29  . Heart disease Paternal Aunt     mi at 75  . Heart disease Paternal Uncle    History  Substance Use Topics  . Smoking status: Never Smoker   . Smokeless tobacco: Current User    Types: Chew  . Alcohol Use: No    Review of Systems  Respiratory: Negative for shortness of breath (pain takes his breath away).   Cardiovascular: Negative for chest pain.    Gastrointestinal: Negative for abdominal pain.  Musculoskeletal: Positive for neck pain.  All other systems reviewed and are negative.     Allergies  Review of patient's allergies indicates no known allergies.  Home Medications   Prior to Admission medications   Medication Sig Start Date End Date Taking? Authorizing Provider  losartan-hydrochlorothiazide (HYZAAR) 100-25 MG per tablet Take 1 tablet by mouth daily. 01/22/14   Susy Frizzle, MD  testosterone cypionate (DEPO-TESTOSTERONE) 200 MG/ML injection Inject 1 mL (200 mg total) into the muscle every 14 (fourteen) days. 12/27/13   Susy Frizzle, MD   BP 154/98  Pulse 91  Temp(Src) 97.6 F (36.4 C) (Oral)  Resp 20  Ht 5\' 5"  (1.651 m)  Wt 193 lb (87.544 kg)  BMI 32.12 kg/m2  SpO2 99% Physical Exam  Nursing note and vitals reviewed. Constitutional: He is oriented to person, place, and time. He appears well-developed and well-nourished. No distress.  HENT:  Head: Normocephalic and atraumatic.  Mouth/Throat: Oropharynx is clear and moist.  Eyes: Conjunctivae are normal. Pupils are equal, round, and reactive to light. No scleral icterus.  Neck: Neck supple.  Cardiovascular: Normal rate, regular rhythm, normal heart sounds and intact distal pulses.   No murmur heard. Pulmonary/Chest: Effort normal and breath sounds normal. No stridor. No respiratory distress. He has no wheezes. He has no rales.  Abdominal: Soft. He exhibits no distension. There is  no tenderness.  Musculoskeletal: Normal range of motion. He exhibits no edema.       Left shoulder: He exhibits normal range of motion (mild pain with active ROM), no tenderness, no deformity, normal pulse and normal strength.       Back:  Neurological: He is alert and oriented to person, place, and time.  Skin: Skin is warm and dry. No rash noted.  Psychiatric: He has a normal mood and affect. His behavior is normal.    ED Course  Procedures (including critical care  time) Labs Review Labs Reviewed  BASIC METABOLIC PANEL - Abnormal; Notable for the following:    Potassium 3.6 (*)    Glucose, Bld 110 (*)    All other components within normal limits  CBC WITH DIFFERENTIAL  TROPONIN I    Imaging Review Dg Chest 2 View  01/26/2014   CLINICAL DATA:  Left chest pain for 2 days.  Hypertension.  EXAM: CHEST  2 VIEW  COMPARISON:  None.  FINDINGS: Lungs are adequately inflated without consolidation or effusion. Cardiomediastinal silhouette and remainder of the exam is unremarkable.  IMPRESSION: No active cardiopulmonary disease.   Electronically Signed   By: Marin Olp M.D.   On: 01/26/2014 08:28  All radiology studies independently viewed by me.      EKG Interpretation   Date/Time:  Saturday January 26 2014 07:31:22 EDT Ventricular Rate:  93 PR Interval:  151 QRS Duration: 90 QT Interval:  336 QTC Calculation: 418 R Axis:   -37 Text Interpretation:  Sinus rhythm Left axis deviation No old tracing to  compare Confirmed by [facility]  MD, TREY ([id number]) on 01/26/2014 8:10:49 AM      MDM   Final diagnoses:  Left shoulder pain    43 yo male presenting with left shoulder pain.  History and exam are consistent with MSK pain, likely exacerbated from playing golf yesterday.  He is well appearing, has normal strength, sensation, and pulses in his arms, and has TTP of left upper back musculature.  Pt concerned primarily about cardiac cause of his pain.  EKG, CXR, and labwork are reassuring.  He appears stable for DC and outpatient follow up.  Have given him return precautions.  Artis Delay, MD 01/26/14 1031

## 2014-01-26 NOTE — ED Notes (Signed)
Pt brought to room, pt undressing and getting into a gown; Natasha, NT present in room

## 2014-01-28 ENCOUNTER — Ambulatory Visit (INDEPENDENT_AMBULATORY_CARE_PROVIDER_SITE_OTHER): Payer: BC Managed Care – PPO | Admitting: Family Medicine

## 2014-01-28 ENCOUNTER — Encounter: Payer: Self-pay | Admitting: Family Medicine

## 2014-01-28 VITALS — BP 120/96 | HR 100 | Temp 98.9°F | Resp 14 | Ht 65.0 in | Wt 187.0 lb

## 2014-01-28 DIAGNOSIS — M5412 Radiculopathy, cervical region: Secondary | ICD-10-CM

## 2014-01-28 MED ORDER — PREDNISONE 20 MG PO TABS: ORAL_TABLET | ORAL | Status: DC

## 2014-01-28 NOTE — Progress Notes (Signed)
   Subjective:    Patient ID: Nathaniel Parrish, male    DOB: [birth year], 43 y.o.   MRN: [id number]  HPI Patient reports pain in the left side of his neck radiating into his left shoulder and down his left arm ever since Saturday. He had been taking Aleve with minimal relief. He denies any numbness or tingling in his arm. Today on examination he has a positive Spurling sign. He has a negative empty can and a negative Hawkins maneuver. He has no pain with range of motion in his left shoulder. Past Medical History  Diagnosis Date  . Prediabetes   . Hypertension   . Hypogonadism in male   . Low testosterone    Past Surgical History  Procedure Laterality Date  . Nasal septum surgery     Current Outpatient Prescriptions on File Prior to Visit  Medication Sig Dispense Refill  . losartan-hydrochlorothiazide (HYZAAR) 100-25 MG per tablet Take 1 tablet by mouth daily.  30 tablet  3  . testosterone cypionate (DEPO-TESTOSTERONE) 200 MG/ML injection Inject 1 mL (200 mg total) into the muscle every 14 (fourteen) days.  10 mL  3   No current facility-administered medications on file prior to visit.   No Known Allergies History   Social History  . Marital Status: Single    Spouse Name: N/A    Number of Children: N/A  . Years of Education: N/A   Occupational History  . Not on file.   Social History Main Topics  . Smoking status: Never Smoker   . Smokeless tobacco: Current User    Types: Chew  . Alcohol Use: No  . Drug Use: No  . Sexual Activity: Yes     Comment: married   Other Topics Concern  . Not on file   Social History Narrative  . No narrative on file       Review of Systems  All other systems reviewed and are negative.      Objective:   Physical Exam  Vitals reviewed. Constitutional: He is oriented to person, place, and time.  Cardiovascular: Normal rate, regular rhythm and normal heart sounds.   No murmur heard. Pulmonary/Chest: Effort normal and breath sounds  normal. No respiratory distress. He has no wheezes. He has no rales.  Abdominal: Soft. Bowel sounds are normal.  Musculoskeletal:       Cervical back: He exhibits decreased range of motion, tenderness, pain and spasm. He exhibits no bony tenderness.  Neurological: He is alert and oriented to person, place, and time. He has normal reflexes. He displays normal reflexes. No cranial nerve deficit. He exhibits normal muscle tone. Coordination normal.          Assessment & Plan:  Cervical radiculopathy - Plan: predniSONE (DELTASONE) 20 MG tablet  Patient symptoms are consistent with cervical radiculopathy. I'll start the patient on a prednisone taper pack. If symptoms have not improved after one week, I will schedule the patient for an MRI of his cervical spine.

## 2014-01-29 ENCOUNTER — Telehealth: Payer: Self-pay | Admitting: Family Medicine

## 2014-01-29 MED ORDER — HYDROCODONE-ACETAMINOPHEN 5-325 MG PO TABS: 1.0000 | ORAL_TABLET | Freq: Four times a day (QID) | ORAL | Status: DC | PRN

## 2014-01-29 NOTE — Telephone Encounter (Signed)
Per [facility] ok to give norco 2/325. Pt here in office and rx given to pt.

## 2014-01-29 NOTE — Telephone Encounter (Signed)
Patient is calling to see if he can get pain medication he was seen yesterday  [phone]

## 2014-03-22 ENCOUNTER — Telehealth: Payer: Self-pay | Admitting: *Deleted

## 2014-03-22 MED ORDER — SILDENAFIL CITRATE 100 MG PO TABS: 100.0000 mg | ORAL_TABLET | ORAL | Status: DC | PRN

## 2014-03-22 NOTE — Telephone Encounter (Signed)
Pt called stating that since the increase of Losartan for his BP he is having issues with ED wants to know if there is something you can prescribe him to help him with this issue. Please advise!  [address]  Call back number [phone]

## 2014-03-22 NOTE — Telephone Encounter (Signed)
lmtrc

## 2014-03-22 NOTE — Telephone Encounter (Signed)
Script sent to patients pharmacy, pt is aware.

## 2014-03-22 NOTE — Telephone Encounter (Signed)
viagra 100 mg (1/2-1 tab poqd prn)

## 2014-05-19 ENCOUNTER — Other Ambulatory Visit: Payer: Self-pay | Admitting: Family Medicine

## 2014-06-06 ENCOUNTER — Ambulatory Visit: Payer: BLUE CROSS/BLUE SHIELD | Admitting: Family Medicine

## 2014-06-10 ENCOUNTER — Ambulatory Visit: Payer: Self-pay | Admitting: Family Medicine

## 2014-06-21 ENCOUNTER — Encounter: Payer: Self-pay | Admitting: Family Medicine

## 2014-06-21 ENCOUNTER — Ambulatory Visit (INDEPENDENT_AMBULATORY_CARE_PROVIDER_SITE_OTHER): Payer: 59 | Admitting: Family Medicine

## 2014-06-21 VITALS — BP 118/90 | HR 90 | Temp 99.7°F | Resp 16 | Wt 191.0 lb

## 2014-06-21 DIAGNOSIS — E291 Testicular hypofunction: Secondary | ICD-10-CM | POA: Diagnosis not present

## 2014-06-21 DIAGNOSIS — I10 Essential (primary) hypertension: Secondary | ICD-10-CM | POA: Diagnosis not present

## 2014-06-21 NOTE — Progress Notes (Signed)
Subjective:    Patient ID: Nathaniel Parrish, male    DOB: 11/27/1970, 44 y.o.   MRN: [id number]  HPI Patient presents today for regular follow-up. He has hypertension as well as hypogonadism. His blood pressure has been running 118-120 over 80s to 90. He denies any chest pain shortness of breath or dyspnea on exertion. His blood pressure is usually better than it is today in clinic. He also is on testosterone 150 mg injected intramuscularly every 2 weeks. He states that on this dose he feels great. He denies any fatigue. He has normal libido. His erectile dysfunction has improved but he does still need Viagra occasionally. He denies any side effects from the medication. He denies any difficulty urinating, weak urinary stream, urgency, or hesitancy his cervical radiculopathy has completely resolved Past Medical History  Diagnosis Date  . Prediabetes   . Hypertension   . Hypogonadism in male   . Low testosterone    Past Surgical History  Procedure Laterality Date  . Nasal septum surgery     Current Outpatient Prescriptions on File Prior to Visit  Medication Sig Dispense Refill  . losartan-hydrochlorothiazide (HYZAAR) 100-25 MG per tablet TAKE 1 TABLET BY MOUTH EVERY DAY. 30 tablet 5  . sildenafil (VIAGRA) 100 MG tablet Take 1 tablet (100 mg total) by mouth as needed for erectile dysfunction. 10 tablet 0  . testosterone cypionate (DEPO-TESTOSTERONE) 200 MG/ML injection Inject 1 mL (200 mg total) into the muscle every 14 (fourteen) days. (Patient taking differently: Inject 150 mg into the muscle every 14 (fourteen) days. ) 10 mL 3  . HYDROcodone-acetaminophen (NORCO) 5-325 MG per tablet Take 1 tablet by mouth every 6 (six) hours as needed for moderate pain. (Patient not taking: Reported on 06/21/2014) 30 tablet 0  . predniSONE (DELTASONE) 20 MG tablet 3 tabs poqday 1-2, 2 tabs poqday 3-4, 1 tab poqday 5-6 (Patient not taking: Reported on 06/21/2014) 12 tablet 0   No current  facility-administered medications on file prior to visit.   No Known Allergies History   Social History  . Marital Status: Single    Spouse Name: N/A  . Number of Children: N/A  . Years of Education: N/A   Occupational History  . Not on file.   Social History Main Topics  . Smoking status: Never Smoker   . Smokeless tobacco: Current User    Types: Chew  . Alcohol Use: No  . Drug Use: No  . Sexual Activity: Yes     Comment: married   Other Topics Concern  . Not on file   Social History Narrative      Review of Systems  All other systems reviewed and are negative.      Objective:   Physical Exam  Constitutional: He appears well-developed and well-nourished.  Neck: Neck supple. No JVD present. No thyromegaly present.  Cardiovascular: Normal rate, regular rhythm and normal heart sounds.   No murmur heard. Pulmonary/Chest: Effort normal and breath sounds normal. No respiratory distress. He has no wheezes. He has no rales.  Abdominal: Soft. Bowel sounds are normal. He exhibits no distension. There is no tenderness. There is no rebound and no guarding.  Musculoskeletal: He exhibits no edema.  Lymphadenopathy:    He has no cervical adenopathy.  Vitals reviewed.         Assessment & Plan:  Hypogonadism in male - Plan: COMPLETE METABOLIC PANEL WITH GFR, CBC with Differential/Platelet, Testosterone, PSA  Benign essential HTN  Patient's exam today is normal.  His blood pressures borderline. I've asked the patient to decrease his caffeine, decrease his salt intake, and try to exercise more to address his blood pressure. I would like him to return fasting at some point to check his cholesterol. Symptomatically his hypogonadism is adequately treated on his testosterone. I will check a testosterone level, a CBC, and a PSA to monitor for any side effects of the testosterone replacement. Follow-up in 6 months or as needed.Marland Kitchen

## 2014-06-22 LAB — CBC WITH DIFFERENTIAL/PLATELET
BASOS PCT: 0 % (ref 0–1)
Basophils Absolute: 0 10*3/uL (ref 0.0–0.1)
EOS PCT: 1 % (ref 0–5)
Eosinophils Absolute: 0.1 10*3/uL (ref 0.0–0.7)
HEMATOCRIT: 49.1 % (ref 39.0–52.0)
Hemoglobin: 17 g/dL (ref 13.0–17.0)
LYMPHS ABS: 2.6 10*3/uL (ref 0.7–4.0)
Lymphocytes Relative: 26 % (ref 12–46)
MCH: 30.7 pg (ref 26.0–34.0)
MCHC: 34.6 g/dL (ref 30.0–36.0)
MCV: 88.8 fL (ref 78.0–100.0)
MONO ABS: 0.8 10*3/uL (ref 0.1–1.0)
MPV: 9.6 fL (ref 8.6–12.4)
Monocytes Relative: 8 % (ref 3–12)
NEUTROS ABS: 6.6 10*3/uL (ref 1.7–7.7)
Neutrophils Relative %: 65 % (ref 43–77)
Platelets: 297 10*3/uL (ref 150–400)
RBC: 5.53 MIL/uL (ref 4.22–5.81)
RDW: 13.2 % (ref 11.5–15.5)
WBC: 10.1 10*3/uL (ref 4.0–10.5)

## 2014-06-22 LAB — COMPLETE METABOLIC PANEL WITH GFR
ALBUMIN: 4.5 g/dL (ref 3.5–5.2)
ALK PHOS: 54 U/L (ref 39–117)
ALT: 34 U/L (ref 0–53)
AST: 22 U/L (ref 0–37)
BILIRUBIN TOTAL: 0.6 mg/dL (ref 0.2–1.2)
BUN: 16 mg/dL (ref 6–23)
CO2: 27 meq/L (ref 19–32)
Calcium: 9.6 mg/dL (ref 8.4–10.5)
Chloride: 100 mEq/L (ref 96–112)
Creat: 0.91 mg/dL (ref 0.50–1.35)
GFR, Est African American: >89 mL/min
GLUCOSE: 111 mg/dL — AB (ref 70–99)
Potassium: 3.2 mEq/L — ABNORMAL LOW (ref 3.5–5.3)
SODIUM: 139 meq/L (ref 135–145)
Total Protein: 7.1 g/dL (ref 6.0–8.3)

## 2014-06-22 LAB — TESTOSTERONE: TESTOSTERONE: 584 ng/dL (ref 300–890)

## 2014-06-22 LAB — PSA: PSA: 0.68 ng/mL (ref <=4.00)

## 2014-06-25 ENCOUNTER — Other Ambulatory Visit: Payer: Self-pay | Admitting: Family Medicine

## 2014-06-25 MED ORDER — POTASSIUM CHLORIDE ER 10 MEQ PO TBCR: 10.0000 meq | EXTENDED_RELEASE_TABLET | Freq: Every day | ORAL | Status: DC

## 2014-10-20 ENCOUNTER — Other Ambulatory Visit: Payer: Self-pay | Admitting: Family Medicine

## 2014-10-21 NOTE — Telephone Encounter (Signed)
ok 

## 2014-10-21 NOTE — Telephone Encounter (Signed)
Last Lab 06/21/14.  LOV 06/21/14  LRF 12/27/13 70ml + 3RF  OK refill?

## 2014-10-22 NOTE — Telephone Encounter (Signed)
Medication refilled per protocol. 

## 2014-10-28 ENCOUNTER — Encounter: Payer: Self-pay | Admitting: Family Medicine

## 2014-10-28 ENCOUNTER — Ambulatory Visit (INDEPENDENT_AMBULATORY_CARE_PROVIDER_SITE_OTHER): Payer: 59 | Admitting: Family Medicine

## 2014-10-28 VITALS — BP 142/90 | HR 68 | Temp 98.0°F | Resp 18 | Ht 65.0 in | Wt 186.0 lb

## 2014-10-28 DIAGNOSIS — N486 Induration penis plastica: Secondary | ICD-10-CM

## 2014-10-28 DIAGNOSIS — I10 Essential (primary) hypertension: Secondary | ICD-10-CM | POA: Diagnosis not present

## 2014-10-28 DIAGNOSIS — E291 Testicular hypofunction: Secondary | ICD-10-CM

## 2014-10-28 MED ORDER — AMLODIPINE BESYLATE 10 MG PO TABS: 10.0000 mg | ORAL_TABLET | Freq: Every day | ORAL | Status: DC

## 2014-10-28 MED ORDER — LOSARTAN POTASSIUM 100 MG PO TABS: 100.0000 mg | ORAL_TABLET | Freq: Every day | ORAL | Status: DC

## 2014-10-28 NOTE — Progress Notes (Signed)
Subjective:    Patient ID: Nathaniel Parrish, male    DOB: 1971/01/31, 44 y.o.   MRN: [id number]  HPI  Patient is here today for follow-up of his medical conditions. He has hypertension. He is currently on Hyzaar 100/25 one by mouth daily. His blood pressure is borderline elevated although he does have white coat syndrome. He checks his blood pressure at home and it is usually much better controlled. Unfortunately when I last checked his blood work in March, he was found to be hypokalemic. His potassium is been running steadily low on this blood pressure medication. He also reports cramping in his arms and legs for no reason. He is interested in possibly switching his blood pressure medicine around to help alleviate this. He continues to take testosterone injections. He would like switching to a topical cream if possible. His last testosterone level was within normal limits in March. His PSA was normal. His CBC was normal. He is not due for lab work again until after September. However he does complain of painful erections. Patient has felt a hard nodular area at the base of his penis. When he gets an erection his penis curves slightly upward. This is been gradually worsening over the last few months. Past Medical History  Diagnosis Date  . Prediabetes   . Hypertension   . Hypogonadism in male   . Low testosterone    Past Surgical History  Procedure Laterality Date  . Nasal septum surgery     Current Outpatient Prescriptions on File Prior to Visit  Medication Sig Dispense Refill  . losartan-hydrochlorothiazide (HYZAAR) 100-25 MG per tablet TAKE 1 TABLET BY MOUTH EVERY DAY. 30 tablet 5  . sildenafil (VIAGRA) 100 MG tablet Take 1 tablet (100 mg total) by mouth as needed for erectile dysfunction. 10 tablet 0  . testosterone cypionate (DEPOTESTOSTERONE CYPIONATE) 200 MG/ML injection INJECT 1 ML INTO THE MUSCLE EVERY 14 DAYS. 10 mL 2   No current facility-administered medications on file prior  to visit.   No Known Allergies History   Social History  . Marital Status: Single    Spouse Name: N/A  . Number of Children: N/A  . Years of Education: N/A   Occupational History  . Not on file.   Social History Main Topics  . Smoking status: Never Smoker   . Smokeless tobacco: Current User    Types: Chew  . Alcohol Use: No  . Drug Use: No  . Sexual Activity: Yes     Comment: married   Other Topics Concern  . Not on file   Social History Narrative     Review of Systems  All other systems reviewed and are negative.      Objective:   Physical Exam  Cardiovascular: Normal rate, regular rhythm and normal heart sounds.   No murmur heard. Pulmonary/Chest: Effort normal and breath sounds normal.  Abdominal: Soft. Bowel sounds are normal.  Musculoskeletal: He exhibits no edema.  Vitals reviewed.         Assessment & Plan:  Benign essential HTN - Plan: losartan (COZAAR) 100 MG tablet, amLODipine (NORVASC) 10 MG tablet  Hypogonadism in male  Peyronie's disease - Plan: Ambulatory referral to Urology  Patient's blood pressure is borderline. Due to his hypokalemia I will discontinue Hyzaar and start the patient on losartan 100 mg by mouth daily and amlodipine 10 mg by mouth daily. Presently his hypogonadism is adequately treated with testosterone replacement. Recheck a testosterone level, PSA, and up CBC after  September. I will also like to check fasting lab work in September to monitor his blood sugar as well as cholesterol. I believe the patient has developed Peyronie's disease.  I will refer to urologist

## 2014-10-29 ENCOUNTER — Telehealth: Payer: Self-pay | Admitting: *Deleted

## 2014-10-29 ENCOUNTER — Encounter: Payer: Self-pay | Admitting: *Deleted

## 2014-10-29 NOTE — Telephone Encounter (Signed)
pt has appt scheduled at [facility] with dr. Louis Meckel on 12/30/14 at 10:30am, lmtrc

## 2014-10-31 NOTE — Telephone Encounter (Signed)
Pt called back and aware of appt 

## 2014-11-11 ENCOUNTER — Telehealth: Payer: Self-pay | Admitting: Family Medicine

## 2014-11-11 DIAGNOSIS — I10 Essential (primary) hypertension: Secondary | ICD-10-CM

## 2014-11-11 DIAGNOSIS — Z79899 Other long term (current) drug therapy: Secondary | ICD-10-CM

## 2014-11-11 NOTE — Telephone Encounter (Signed)
Since starting Losartan and Amlodipine has been having a lot os swelling in lower extremities.  He has stopped both since last Tuesday.  Swelling much better.  Please advise?

## 2014-11-12 ENCOUNTER — Encounter: Payer: Self-pay | Admitting: Family Medicine

## 2014-11-12 MED ORDER — LOSARTAN POTASSIUM-HCTZ 100-25 MG PO TABS: 1.0000 | ORAL_TABLET | Freq: Every day | ORAL | Status: DC

## 2014-11-12 NOTE — Telephone Encounter (Signed)
Pt called back.  Told to stop both amlodipine and plain losartan.  To resume combination med Hyzaar (losartan/hctz)  Stop by office in two weeks for [place] to check potassium.  Anytime does not need to be fasting.  RX to pharmacy and ordered.

## 2014-11-12 NOTE — Telephone Encounter (Signed)
lmtcb

## 2014-11-12 NOTE — Telephone Encounter (Signed)
The swelling is due to amlodipine not the losartan.  He has been on losartan in the past in the hyzaar.  He could resume his previous dose of hyzaar and dc amlodipine and losartan but we will need to recheck his potassium in 2 weeks.

## 2014-11-13 ENCOUNTER — Encounter: Payer: Self-pay | Admitting: Family Medicine

## 2014-11-13 MED ORDER — TESTOSTERONE 20.25 MG/ACT (1.62%) TD GEL: TRANSDERMAL | Status: DC

## 2014-11-13 NOTE — Telephone Encounter (Signed)
Medication faxd to requested pharmacy

## 2014-11-16 ENCOUNTER — Other Ambulatory Visit: Payer: Self-pay | Admitting: Family Medicine

## 2015-05-05 ENCOUNTER — Other Ambulatory Visit: Payer: Self-pay | Admitting: Family Medicine

## 2015-05-05 NOTE — Telephone Encounter (Signed)
Prescription faxed

## 2015-05-05 NOTE — Telephone Encounter (Signed)
ok 

## 2015-05-05 NOTE — Telephone Encounter (Signed)
Ok to refill 

## 2015-05-06 ENCOUNTER — Other Ambulatory Visit: Payer: Self-pay | Admitting: Family Medicine

## 2015-07-17 ENCOUNTER — Encounter: Payer: Self-pay | Admitting: Family Medicine

## 2015-07-17 ENCOUNTER — Ambulatory Visit (INDEPENDENT_AMBULATORY_CARE_PROVIDER_SITE_OTHER): Payer: Managed Care, Other (non HMO) | Admitting: Family Medicine

## 2015-07-17 VITALS — BP 126/72 | HR 78 | Temp 98.3°F | Resp 16 | Ht 65.0 in | Wt 191.0 lb

## 2015-07-17 DIAGNOSIS — E291 Testicular hypofunction: Secondary | ICD-10-CM

## 2015-07-17 DIAGNOSIS — I10 Essential (primary) hypertension: Secondary | ICD-10-CM | POA: Diagnosis not present

## 2015-07-17 MED ORDER — TESTOSTERONE CYPIONATE 200 MG/ML IM SOLN: INTRAMUSCULAR | Status: DC

## 2015-07-17 NOTE — Progress Notes (Signed)
Subjective:    Patient ID: Nathaniel Parrish, male    DOB: [birth year], 45 y.o.   MRN: [id number]  HPI  10/28/14 Patient is here today for follow-up of his medical conditions. He has hypertension. He is currently on Hyzaar 100/25 one by mouth daily. His blood pressure is borderline elevated although he does have white coat syndrome. He checks his blood pressure at home and it is usually much better controlled. Unfortunately when I last checked his blood work in March, he was found to be hypokalemic. His potassium is been running steadily low on this blood pressure medication. He also reports cramping in his arms and legs for no reason. He is interested in possibly switching his blood pressure medicine around to help alleviate this. He continues to take testosterone injections. He would like switching to a topical cream if possible. His last testosterone level was within normal limits in March. His PSA was normal. His CBC was normal. He is not due for lab work again until after September. However he does complain of painful erections. Patient has felt a hard nodular area at the base of his penis. When he gets an erection his penis curves slightly upward. This is been gradually worsening over the last few months.  At that time, my plan was: Patient's blood pressure is borderline. Due to his hypokalemia I will discontinue Hyzaar and start the patient on losartan 100 mg by mouth daily and amlodipine 10 mg by mouth daily. Presently his hypogonadism is adequately treated with testosterone replacement. Recheck a testosterone level, PSA, and up CBC after September. I will also like to check fasting lab work in September to monitor his blood sugar as well as cholesterol. I believe the patient has developed Peyronie's disease.  I will refer to urologist  The swelling is due to amlodipine not the losartan.  He has been on losartan in the past in the hyzaar.  He could resume his previous dose of hyzaar and dc  amlodipine and losartan but we will need to recheck his potassium in 2 weeks.  07/17/15 At his last office visit, his lab suggested borderline prediabetes. He has tried to change his diet. He is avoiding carbohydrates including breads rice potatoes pollicis, sodas, and desserts. He is overdue to recheck a fasting blood sugar. He states that AndroGel is not working as well as testosterone cypionate. He would like to switch back to the injectable testosterone. He also blames the AndroGel on increased hair growth on his shoulders and chest were he has been applying the gel. Furthermore he does not feel that the medication works as well. He is overdue for PSA as well as a CBC and a testosterone level. He denies any chest pain shortness of breath or dyspnea on exertion. He denies any polyuria, polydipsia, or blurred vision. Past Medical History  Diagnosis Date  . Prediabetes   . Hypertension   . Hypogonadism in male   . Low testosterone    Past Surgical History  Procedure Laterality Date  . Nasal septum surgery     Current Outpatient Prescriptions on File Prior to Visit  Medication Sig Dispense Refill  . ANDROGEL PUMP 20.25 MG/ACT (1.62%) GEL USE 2 PUMPS ON THE SKIN EVERY DAY AS DIRECTED 75 g 5  . losartan-hydrochlorothiazide (HYZAAR) 100-25 MG per tablet TAKE 1 TABLET BY MOUTH EVERY DAY. 30 tablet 11  . losartan-hydrochlorothiazide (HYZAAR) 100-25 MG tablet TAKE 1 TABLET BY MOUTH DAILY 30 tablet 5  . sildenafil (VIAGRA) 100 MG  tablet Take 1 tablet (100 mg total) by mouth as needed for erectile dysfunction. 10 tablet 0  . testosterone cypionate (DEPOTESTOSTERONE CYPIONATE) 200 MG/ML injection INJECT 1 ML INTO THE MUSCLE EVERY 14 DAYS. 10 mL 2   No current facility-administered medications on file prior to visit.   No Known Allergies Social History   Social History  . Marital Status: Single    Spouse Name: N/A  . Number of Children: N/A  . Years of Education: N/A   Occupational History  .  Not on file.   Social History Main Topics  . Smoking status: Never Smoker   . Smokeless tobacco: Current User    Types: Chew  . Alcohol Use: No  . Drug Use: No  . Sexual Activity: Yes     Comment: married   Other Topics Concern  . Not on file   Social History Narrative     Review of Systems  All other systems reviewed and are negative.      Objective:   Physical Exam  Cardiovascular: Normal rate, regular rhythm and normal heart sounds.   No murmur heard. Pulmonary/Chest: Effort normal and breath sounds normal.  Abdominal: Soft. Bowel sounds are normal.  Musculoskeletal: He exhibits no edema.  Vitals reviewed.         Assessment & Plan:  Essential hypertension - Plan: CBC with Differential/Platelet, COMPLETE METABOLIC PANEL WITH GFR, Lipid panel  Hypogonadism in male - Plan: PSA, Testosterone, testosterone cypionate (DEPOTESTOSTERONE CYPIONATE) 200 MG/ML injection  Blood pressures well controlled. I'll make no changes in his blood pressure medication. I have asked the patient to return fasting for a CBC, CMP, fasting lipid panel. Because of his hypogonadism, I will also check a testosterone level along with a PSA. If his fasting blood sugar is elevated, we may need to also add a hemoglobin A1c

## 2015-07-18 ENCOUNTER — Other Ambulatory Visit: Payer: Managed Care, Other (non HMO)

## 2015-07-23 ENCOUNTER — Other Ambulatory Visit: Payer: Managed Care, Other (non HMO)

## 2015-07-23 ENCOUNTER — Ambulatory Visit (INDEPENDENT_AMBULATORY_CARE_PROVIDER_SITE_OTHER): Payer: Managed Care, Other (non HMO) | Admitting: Family Medicine

## 2015-07-23 DIAGNOSIS — E291 Testicular hypofunction: Secondary | ICD-10-CM

## 2015-07-23 LAB — COMPLETE METABOLIC PANEL WITH GFR
ALK PHOS: 59 U/L (ref 40–115)
ALT: 25 U/L (ref 9–46)
AST: 15 U/L (ref 10–40)
Albumin: 4.5 g/dL (ref 3.6–5.1)
BILIRUBIN TOTAL: 0.7 mg/dL (ref 0.2–1.2)
BUN: 21 mg/dL (ref 7–25)
CO2: 30 mmol/L (ref 20–31)
CREATININE: 0.8 mg/dL (ref 0.60–1.35)
Calcium: 10.1 mg/dL (ref 8.6–10.3)
Chloride: 100 mmol/L (ref 98–110)
GFR, Est Non African American: >89 mL/min (ref >=60)
GLUCOSE: 102 mg/dL — AB (ref 70–99)
Potassium: 4.5 mmol/L (ref 3.5–5.3)
Sodium: 138 mmol/L (ref 135–146)
TOTAL PROTEIN: 7 g/dL (ref 6.1–8.1)

## 2015-07-23 LAB — CBC WITH DIFFERENTIAL/PLATELET
BASOS ABS: 0 {cells}/uL (ref 0–200)
BASOS PCT: 0 %
EOS ABS: 166 {cells}/uL (ref 15–500)
Eosinophils Relative: 2 %
HCT: 47.8 % (ref 38.5–50.0)
HEMOGLOBIN: 15.8 g/dL (ref 13.0–17.0)
Lymphocytes Relative: 30 %
Lymphs Abs: 2490 cells/uL (ref 850–3900)
MCH: 29.9 pg (ref 27.0–33.0)
MCHC: 33.1 g/dL (ref 32.0–36.0)
MCV: 90.5 fL (ref 80.0–100.0)
MONOS PCT: 10 %
MPV: 9.1 fL (ref 7.5–12.5)
Monocytes Absolute: 830 cells/uL (ref 200–950)
Neutro Abs: 4814 cells/uL (ref 1500–7800)
Neutrophils Relative %: 58 %
PLATELETS: 284 10*3/uL (ref 140–400)
RBC: 5.28 MIL/uL (ref 4.20–5.80)
RDW: 13.5 % (ref 11.0–15.0)
WBC: 8.3 10*3/uL (ref 3.8–10.8)

## 2015-07-23 LAB — LIPID PANEL
Cholesterol: 165 mg/dL (ref 125–200)
HDL: 42 mg/dL (ref >=40)
LDL CALC: 96 mg/dL (ref <130)
TRIGLYCERIDES: 133 mg/dL (ref <150)
Total CHOL/HDL Ratio: 3.9 Ratio (ref <=5.0)
VLDL: 27 mg/dL (ref <30)

## 2015-07-23 MED ORDER — TESTOSTERONE CYPIONATE 200 MG/ML IM SOLN
200.0000 mg | Freq: Once | INTRAMUSCULAR | Status: AC
  Administered 2015-07-23: 200 mg via INTRAMUSCULAR

## 2015-07-24 LAB — PSA: PSA: 0.87 ng/mL (ref <=4.00)

## 2015-07-24 LAB — TESTOSTERONE: Testosterone: 152 ng/dL — ABNORMAL LOW (ref 250–827)

## 2015-09-19 IMAGING — CR DG CHEST 2V
2 series · 2 of 2 positions shown · non-contrast
Comparison: None.

CLINICAL DATA: Left chest pain for 2 days.  Hypertension.

EXAM:
CHEST  2 VIEW

[w chest pa]
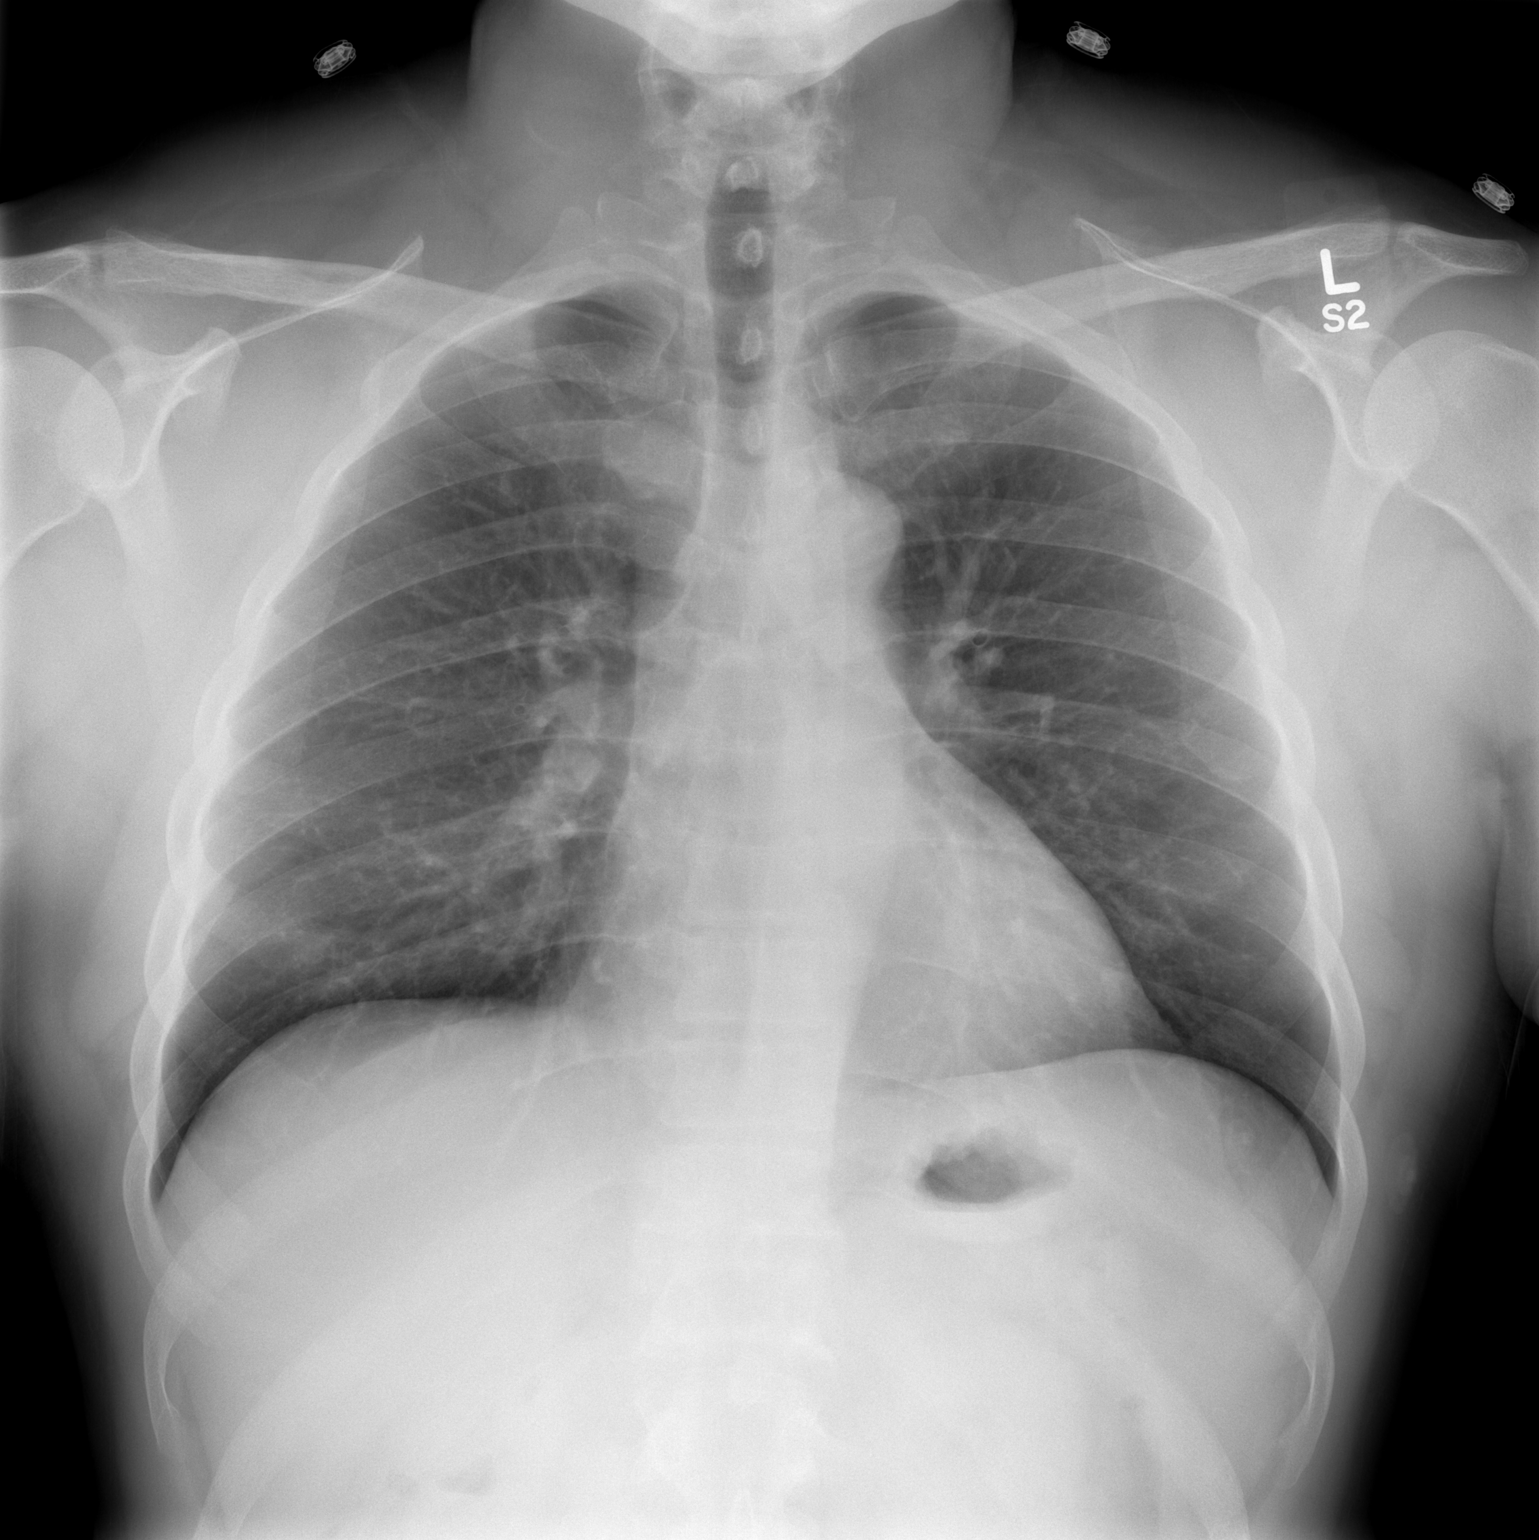

[w chest lat]
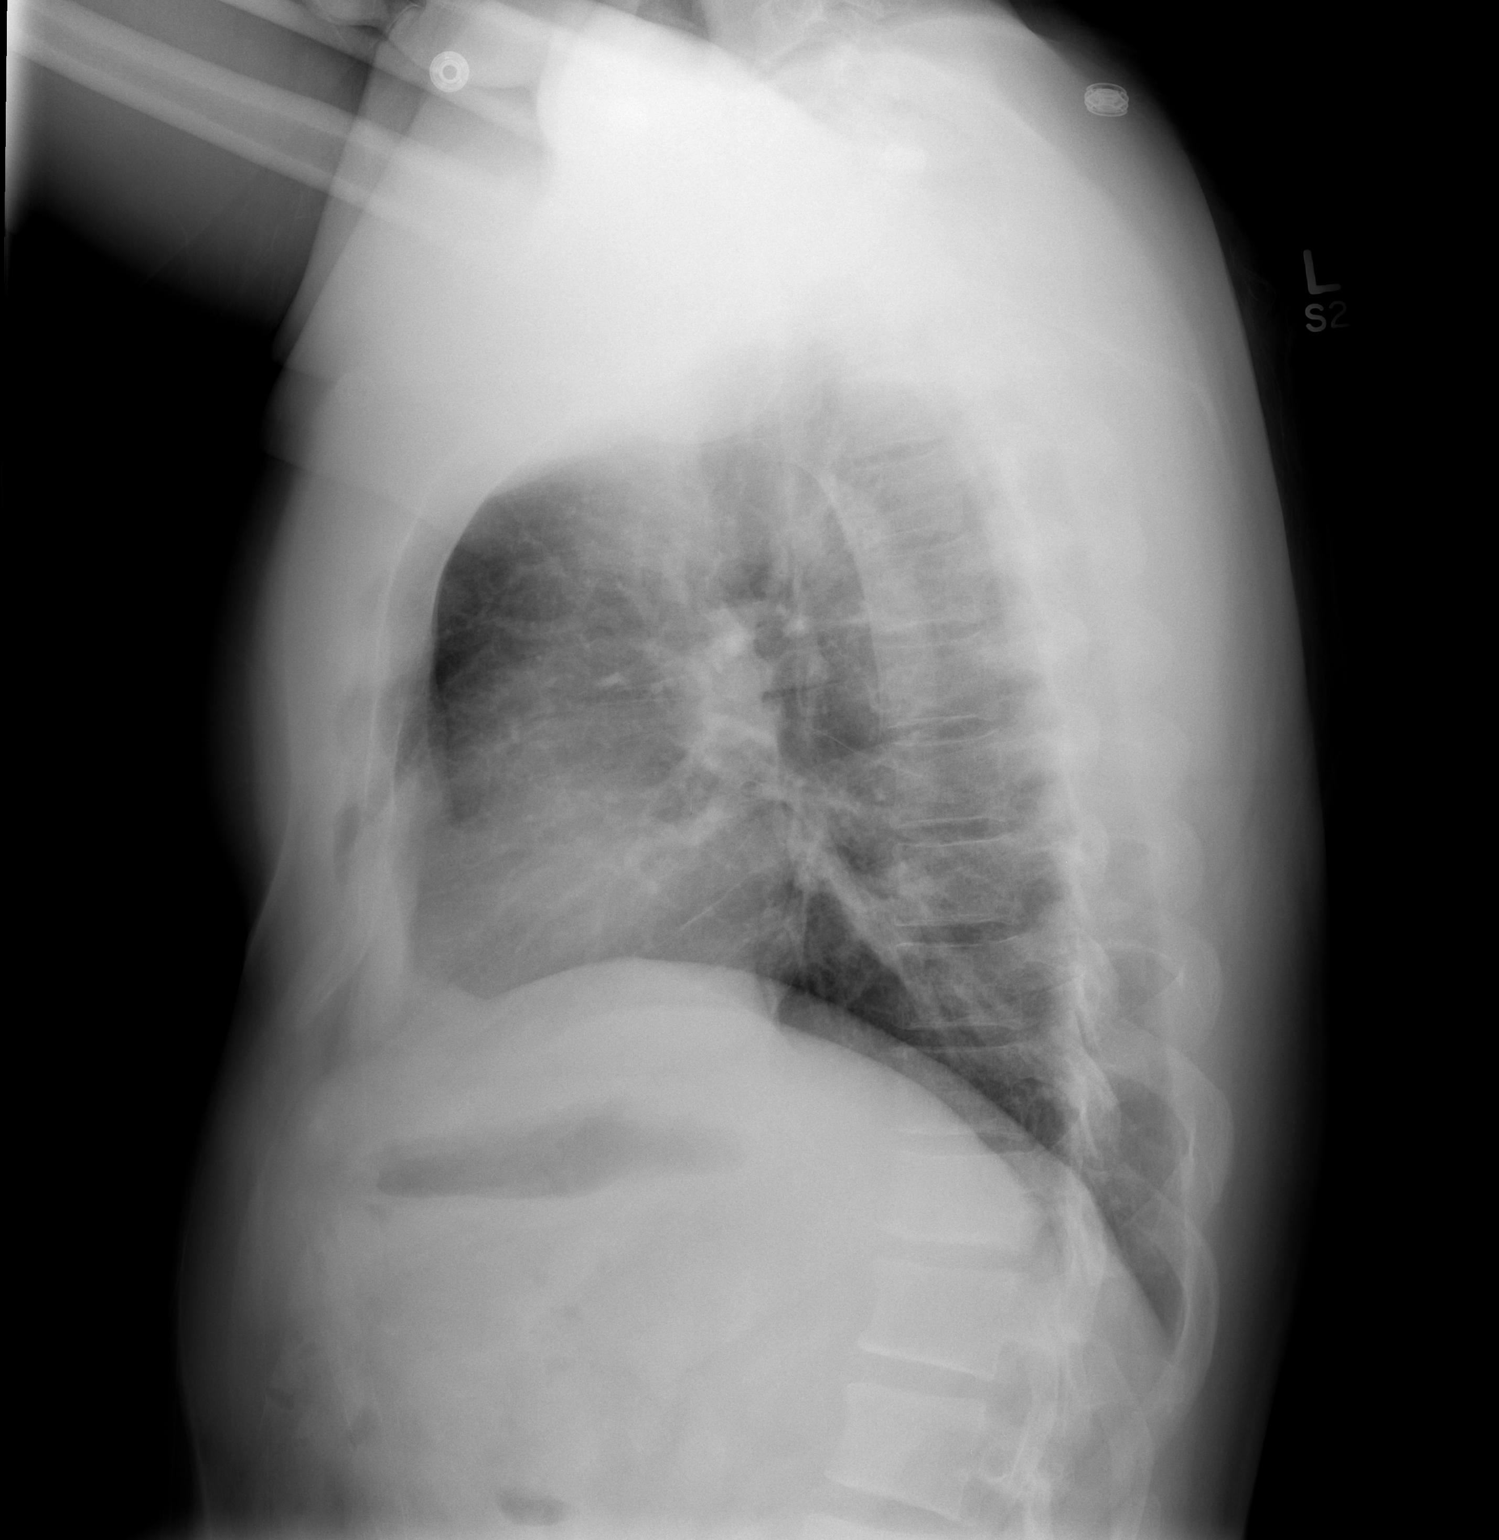

[2 of 2 positions shown; findings below may reference images not displayed]

FINDINGS: Lungs are adequately inflated without consolidation or effusion.
Cardiomediastinal silhouette and remainder of the exam is
unremarkable.
IMPRESSION: No active cardiopulmonary disease.

## 2015-12-04 ENCOUNTER — Other Ambulatory Visit: Payer: Self-pay | Admitting: Family Medicine

## 2016-01-01 ENCOUNTER — Other Ambulatory Visit: Payer: Self-pay | Admitting: Family Medicine

## 2016-01-27 ENCOUNTER — Other Ambulatory Visit: Payer: Self-pay | Admitting: Family Medicine

## 2016-01-27 DIAGNOSIS — E291 Testicular hypofunction: Secondary | ICD-10-CM

## 2016-01-27 NOTE — Telephone Encounter (Signed)
Ok to refill 

## 2016-01-27 NOTE — Telephone Encounter (Signed)
ok 

## 2016-01-29 ENCOUNTER — Other Ambulatory Visit: Payer: Self-pay | Admitting: Family Medicine

## 2016-05-24 ENCOUNTER — Other Ambulatory Visit: Payer: Self-pay | Admitting: Family Medicine

## 2016-06-17 ENCOUNTER — Encounter: Payer: Self-pay | Admitting: Family Medicine

## 2016-06-17 ENCOUNTER — Ambulatory Visit (INDEPENDENT_AMBULATORY_CARE_PROVIDER_SITE_OTHER): Payer: 59 | Admitting: Family Medicine

## 2016-06-17 VITALS — BP 118/90 | HR 80 | Temp 98.2°F | Resp 16 | Ht 65.0 in | Wt 187.0 lb

## 2016-06-17 DIAGNOSIS — M109 Gout, unspecified: Secondary | ICD-10-CM | POA: Diagnosis not present

## 2016-06-17 LAB — BASIC METABOLIC PANEL WITH GFR
BUN: 17 mg/dL (ref 7–25)
CHLORIDE: 98 mmol/L (ref 98–110)
CO2: 28 mmol/L (ref 20–31)
Calcium: 9.9 mg/dL (ref 8.6–10.3)
Creat: 1.01 mg/dL (ref 0.60–1.35)
GFR, Est African American: >89 mL/min (ref >=60)
GFR, Est Non African American: 88 mL/min (ref >=60)
GLUCOSE: 97 mg/dL (ref 70–99)
POTASSIUM: 4.1 mmol/L (ref 3.5–5.3)
Sodium: 137 mmol/L (ref 135–146)

## 2016-06-17 LAB — URIC ACID: Uric Acid, Serum: 6.2 mg/dL (ref 4.0–8.0)

## 2016-06-17 MED ORDER — PREDNISONE 20 MG PO TABS: ORAL_TABLET | ORAL | 0 refills | Status: DC

## 2016-06-17 NOTE — Progress Notes (Signed)
   Subjective:    Patient ID: Nathaniel Parrish, male    DOB: [birth year], 46 y.o.   MRN: [id number]  HPI  Patient is a very pleasant 46 year old white male who presents today with acute onset of pain in his right great toe at the first MTP joint. He states the symptoms began Friday. The pain was vague. Saturday he played golf. By Sunday he could not walk on the foot. The pain was centered in the first MTP joint without provocation. It improved slightly over Monday and Tuesday and again today it flared right back up. He denies any specific injury. There is pain with range of motion in the first MTP joint. The joint is slightly warm. There is no significant effusion or erythema Past Medical History:  Diagnosis Date  . Hypertension   . Hypogonadism in male   . Low testosterone   . Prediabetes    Past Surgical History:  Procedure Laterality Date  . NASAL SEPTUM SURGERY     Current Outpatient Prescriptions on File Prior to Visit  Medication Sig Dispense Refill  . losartan-hydrochlorothiazide (HYZAAR) 100-25 MG tablet TAKE 1 TABLET BY MOUTH EVERY DAY 30 tablet 0  . testosterone cypionate (DEPOTESTOSTERONE CYPIONATE) 200 MG/ML injection INJECT 1 ML INTO THE MUSCLE EVERY 14 DAYS 10 mL 1   No current facility-administered medications on file prior to visit.    No Known Allergies Social History   Social History  . Marital status: Single    Spouse name: N/A  . Number of children: N/A  . Years of education: N/A   Occupational History  . Not on file.   Social History Main Topics  . Smoking status: Never Smoker  . Smokeless tobacco: Current User    Types: Chew  . Alcohol use No  . Drug use: No  . Sexual activity: Yes     Comment: married   Other Topics Concern  . Not on file   Social History Narrative  . No narrative on file     Review of Systems  All other systems reviewed and are negative.      Objective:   Physical Exam  Constitutional: He appears well-developed and  well-nourished.  Cardiovascular: Normal rate, regular rhythm and normal heart sounds.   Pulmonary/Chest: Effort normal and breath sounds normal.  Musculoskeletal:       Right foot: There is decreased range of motion, tenderness and bony tenderness. There is no swelling, no crepitus and no deformity.       Feet:  Vitals reviewed.         Assessment & Plan:  Podagra - Plan: Uric acid, BASIC METABOLIC PANEL WITH GFR, predniSONE (DELTASONE) 20 MG tablet  I suspect the patient is having a gout flare. Begin prednisone taper pack. Check uric acid level. This is the first time he's had a gout exacerbation. I will check renal function. If the pain improves by Monday and lab work suggests gout, no further workup is necessary. If pain is not improving and lab work does not suggest gout, I would proceed with an x-ray of the foot

## 2016-06-18 ENCOUNTER — Encounter: Payer: Self-pay | Admitting: Family Medicine

## 2016-06-21 ENCOUNTER — Ambulatory Visit: Payer: Self-pay | Admitting: Family Medicine

## 2016-06-29 ENCOUNTER — Other Ambulatory Visit: Payer: Self-pay | Admitting: Family Medicine

## 2016-08-16 ENCOUNTER — Other Ambulatory Visit: Payer: Self-pay | Admitting: Family Medicine

## 2016-08-16 DIAGNOSIS — E291 Testicular hypofunction: Secondary | ICD-10-CM

## 2016-08-16 NOTE — Telephone Encounter (Signed)
ok 

## 2016-08-16 NOTE — Telephone Encounter (Signed)
Ok to refill 

## 2016-08-17 ENCOUNTER — Other Ambulatory Visit: Payer: Self-pay | Admitting: Family Medicine

## 2016-08-17 DIAGNOSIS — E291 Testicular hypofunction: Secondary | ICD-10-CM

## 2016-08-17 NOTE — Telephone Encounter (Signed)
Medication called to pharmacy. 

## 2016-09-22 ENCOUNTER — Other Ambulatory Visit: Payer: Self-pay | Admitting: Family Medicine

## 2016-10-23 ENCOUNTER — Other Ambulatory Visit: Payer: Self-pay | Admitting: Family Medicine

## 2017-01-02 ENCOUNTER — Other Ambulatory Visit: Payer: Self-pay | Admitting: Family Medicine

## 2017-01-02 ENCOUNTER — Encounter: Payer: Self-pay | Admitting: Family Medicine

## 2017-01-02 DIAGNOSIS — E291 Testicular hypofunction: Secondary | ICD-10-CM

## 2017-01-03 ENCOUNTER — Other Ambulatory Visit: Payer: Self-pay | Admitting: Family Medicine

## 2017-01-03 DIAGNOSIS — E291 Testicular hypofunction: Secondary | ICD-10-CM

## 2017-01-04 MED ORDER — TESTOSTERONE CYPIONATE 200 MG/ML IM SOLN: 200.0000 mg | INTRAMUSCULAR | 0 refills | Status: DC

## 2017-05-29 ENCOUNTER — Other Ambulatory Visit: Payer: Self-pay | Admitting: Family Medicine

## 2017-05-29 DIAGNOSIS — E291 Testicular hypofunction: Secondary | ICD-10-CM

## 2017-05-30 MED ORDER — TESTOSTERONE CYPIONATE 200 MG/ML IM SOLN: 200.0000 mg | INTRAMUSCULAR | 0 refills | Status: DC

## 2017-05-30 NOTE — Telephone Encounter (Signed)
Requesting refill Testosterone       LOV: 06/17/16  LRF:  01/04/17

## 2017-05-30 NOTE — Telephone Encounter (Signed)
NTBS.

## 2017-06-03 ENCOUNTER — Encounter: Payer: Self-pay | Admitting: Family Medicine

## 2017-06-03 ENCOUNTER — Ambulatory Visit: Payer: 59 | Admitting: Family Medicine

## 2017-06-03 VITALS — BP 110/80 | HR 88 | Temp 97.9°F | Resp 14 | Ht 65.0 in | Wt 184.0 lb

## 2017-06-03 DIAGNOSIS — Z23 Encounter for immunization: Secondary | ICD-10-CM

## 2017-06-03 DIAGNOSIS — E291 Testicular hypofunction: Secondary | ICD-10-CM

## 2017-06-03 DIAGNOSIS — Z8739 Personal history of other diseases of the musculoskeletal system and connective tissue: Secondary | ICD-10-CM | POA: Diagnosis not present

## 2017-06-03 DIAGNOSIS — I10 Essential (primary) hypertension: Secondary | ICD-10-CM | POA: Diagnosis not present

## 2017-06-03 NOTE — Addendum Note (Signed)
Addended by: Shary Decamp B on: 06/03/2017 03:22 PM   Modules accepted: Orders

## 2017-06-03 NOTE — Progress Notes (Signed)
Subjective:    Patient ID: Nathaniel Parrish, male    DOB: December 13, 1970, 47 y.o.   MRN: [id number]  HPI 06/17/16 Patient is a very pleasant 47 year old white male who presents today with acute onset of pain in his right great toe at the first MTP joint. He states the symptoms began Friday. The pain was vague. Saturday he played golf. By Sunday he could not walk on the foot. The pain was centered in the first MTP joint without provocation. It improved slightly over Monday and Tuesday and again today it flared right back up. He denies any specific injury. There is pain with range of motion in the first MTP joint. The joint is slightly warm. There is no significant effusion or erythema.  At that time, my plan was:  I suspect the patient is having a gout flare. Begin prednisone taper pack. Check uric acid level. This is the first time he's had a gout exacerbation. I will check renal function. If the pain improves by Monday and lab work suggests gout, no further workup is necessary. If pain is not improving and lab work does not suggest gout, I would proceed with an x-ray of the foot  06/03/17 Patient has not been seen since.  He has not had a gout flare in more than a year.  His uric acid level was elevated at 6.2 but at the present time I have made the decision not to start him on prophylactic medication given the relative frequent attacks.  He is here today at my request for lab work.  He has a history of hypertension.  His blood pressure is currently well controlled on Hyzaar.  Blood pressure today is 110/80.  He denies any chest pain shortness of breath or dyspnea on exertion.  He also has a history of hypogonadism currently on testosterone replacement.  If he misses a shot, he feels extremely fatigued.  Therefore he still sees benefit from the testosterone replacement.  He is overdue for a CBC to monitor for polycythemia as well as a PSA to monitor for prostate enlargement.  We again discussed the risk and  benefits of testosterone replacement today and he still sees significant benefit to justify taking the replacement therapy.  He is also due for a flu shot as well as a tetanus shot.  He declines HIV screening Past Medical History:  Diagnosis Date  . Hypertension   . Hypogonadism in male   . Low testosterone   . Prediabetes    Past Surgical History:  Procedure Laterality Date  . NASAL SEPTUM SURGERY     Current Outpatient Medications on File Prior to Visit  Medication Sig Dispense Refill  . losartan-hydrochlorothiazide (HYZAAR) 100-25 MG tablet TAKE 1 TABLET BY MOUTH EVERY DAY 90 tablet 3  . testosterone cypionate (DEPOTESTOSTERONE CYPIONATE) 200 MG/ML injection Inject 1 mL (200 mg total) into the muscle every 14 (fourteen) days. 10 mL 0   No current facility-administered medications on file prior to visit.    No Known Allergies Social History   Socioeconomic History  . Marital status: Single    Spouse name: Not on file  . Number of children: Not on file  . Years of education: Not on file  . Highest education level: Not on file  Social Needs  . Financial resource strain: Not on file  . Food insecurity - worry: Not on file  . Food insecurity - inability: Not on file  . Transportation needs - medical: Not on file  .  Transportation needs - non-medical: Not on file  Occupational History  . Not on file  Tobacco Use  . Smoking status: Never Smoker  . Smokeless tobacco: Current User    Types: Chew  Substance and Sexual Activity  . Alcohol use: No  . Drug use: No  . Sexual activity: Yes    Comment: married  Other Topics Concern  . Not on file  Social History Narrative  . Not on file     Review of Systems  All other systems reviewed and are negative.      Objective:   Physical Exam  Constitutional: He appears well-developed and well-nourished.  Neck: Neck supple. No JVD present. No thyromegaly present.  Cardiovascular: Normal rate, regular rhythm, normal heart  sounds and intact distal pulses.  No murmur heard. Pulmonary/Chest: Effort normal and breath sounds normal. No respiratory distress. He has no wheezes. He has no rales. He exhibits no tenderness.  Abdominal: Soft. Bowel sounds are normal. He exhibits no distension and no mass. There is no tenderness. There is no rebound and no guarding.  Musculoskeletal: He exhibits no edema.  Lymphadenopathy:    He has no cervical adenopathy.  Vitals reviewed.         Assessment & Plan:  Essential hypertension - Plan: CBC with Differential/Platelet, COMPLETE METABOLIC PANEL WITH GFR, Lipid panel  Hypogonadism, male - Plan: CBC with Differential/Platelet, COMPLETE METABOLIC PANEL WITH GFR, Lipid panel, PSA, Testosterone  History of gout Patient has a history of gout but his attacks are infrequent and therefore I have not recommended allopurinol.  Continue to monitor for the present time.  Patient elects to receive the flu shot today along with a Tdap.  He declines HIV screening.  His blood pressure is well controlled.  I will make no change in the Hyzaar at the present time.  I have asked him to return fasting for a CBC, CMP, fasting lipid panel, PSA, and a testosterone level to monitor the effectiveness of therapy and to screen for adverse side effects.  Clinically his hypogonadism is well controlled

## 2017-06-07 ENCOUNTER — Other Ambulatory Visit: Payer: 59

## 2017-06-10 ENCOUNTER — Other Ambulatory Visit: Payer: 59

## 2017-06-10 DIAGNOSIS — E291 Testicular hypofunction: Secondary | ICD-10-CM | POA: Diagnosis not present

## 2017-06-10 DIAGNOSIS — I10 Essential (primary) hypertension: Secondary | ICD-10-CM | POA: Diagnosis not present

## 2017-06-11 LAB — COMPLETE METABOLIC PANEL WITH GFR
AG Ratio: 2 (calc) (ref 1.0–2.5)
ALT: 22 U/L (ref 9–46)
AST: 15 U/L (ref 10–40)
Albumin: 4.3 g/dL (ref 3.6–5.1)
Alkaline phosphatase (APISO): 52 U/L (ref 40–115)
BUN: 21 mg/dL (ref 7–25)
CALCIUM: 9.7 mg/dL (ref 8.6–10.3)
CO2: 31 mmol/L (ref 20–32)
CREATININE: 1.04 mg/dL (ref 0.60–1.35)
Chloride: 102 mmol/L (ref 98–110)
GFR, EST NON AFRICAN AMERICAN: 85 mL/min/{1.73_m2} (ref >=60)
GFR, Est African American: 98 mL/min/{1.73_m2} (ref >=60)
GLOBULIN: 2.1 g/dL (ref 1.9–3.7)
GLUCOSE: 106 mg/dL — AB (ref 65–99)
Potassium: 3.9 mmol/L (ref 3.5–5.3)
Sodium: 142 mmol/L (ref 135–146)
Total Bilirubin: 0.6 mg/dL (ref 0.2–1.2)
Total Protein: 6.4 g/dL (ref 6.1–8.1)

## 2017-06-11 LAB — PSA: PSA: 0.8 ng/mL (ref <=4.0)

## 2017-06-11 LAB — CBC WITH DIFFERENTIAL/PLATELET
Basophils Absolute: 58 cells/uL (ref 0–200)
Basophils Relative: 0.7 %
EOS PCT: 1.3 %
Eosinophils Absolute: 108 cells/uL (ref 15–500)
HEMATOCRIT: 47.5 % (ref 38.5–50.0)
Hemoglobin: 16.8 g/dL (ref 13.2–17.1)
Lymphs Abs: 2432 cells/uL (ref 850–3900)
MCH: 31.2 pg (ref 27.0–33.0)
MCHC: 35.4 g/dL (ref 32.0–36.0)
MCV: 88.1 fL (ref 80.0–100.0)
MPV: 9.5 fL (ref 7.5–12.5)
Monocytes Relative: 9 %
NEUTROS PCT: 59.7 %
Neutro Abs: 4955 cells/uL (ref 1500–7800)
PLATELETS: 280 10*3/uL (ref 140–400)
RBC: 5.39 10*6/uL (ref 4.20–5.80)
RDW: 12.5 % (ref 11.0–15.0)
Total Lymphocyte: 29.3 %
WBC mixed population: 747 cells/uL (ref 200–950)
WBC: 8.3 10*3/uL (ref 3.8–10.8)

## 2017-06-11 LAB — LIPID PANEL
CHOL/HDL RATIO: 3.5 (calc) (ref <5.0)
Cholesterol: 157 mg/dL (ref <200)
HDL: 45 mg/dL (ref >40)
LDL Cholesterol (Calc): 93 mg/dL (calc)
NON-HDL CHOLESTEROL (CALC): 112 mg/dL (ref <130)
TRIGLYCERIDES: 95 mg/dL (ref <150)

## 2017-06-11 LAB — TESTOSTERONE: Testosterone: 557 ng/dL (ref 250–827)

## 2017-06-13 ENCOUNTER — Encounter: Payer: Self-pay | Admitting: Family Medicine

## 2017-10-13 ENCOUNTER — Other Ambulatory Visit: Payer: Self-pay | Admitting: Family Medicine

## 2017-10-13 DIAGNOSIS — E291 Testicular hypofunction: Secondary | ICD-10-CM

## 2017-10-14 ENCOUNTER — Other Ambulatory Visit: Payer: Self-pay | Admitting: Family Medicine

## 2017-10-14 DIAGNOSIS — E291 Testicular hypofunction: Secondary | ICD-10-CM

## 2017-10-14 MED ORDER — TESTOSTERONE CYPIONATE 200 MG/ML IM SOLN: 200.0000 mg | INTRAMUSCULAR | 1 refills | Status: DC

## 2017-10-14 NOTE — Telephone Encounter (Signed)
Please send testosterone to pharm - considered a controlled.  LOV: 06/03/17

## 2017-10-29 ENCOUNTER — Other Ambulatory Visit: Payer: Self-pay | Admitting: Family Medicine

## 2018-01-26 ENCOUNTER — Other Ambulatory Visit: Payer: Self-pay | Admitting: Family Medicine

## 2018-01-30 ENCOUNTER — Encounter: Payer: Self-pay | Admitting: Family Medicine

## 2018-01-31 MED ORDER — LOSARTAN POTASSIUM-HCTZ 100-25 MG PO TABS: 1.0000 | ORAL_TABLET | Freq: Every day | ORAL | 1 refills | Status: DC

## 2018-02-03 ENCOUNTER — Other Ambulatory Visit: Payer: Self-pay | Admitting: Family Medicine

## 2018-02-03 MED ORDER — LOSARTAN POTASSIUM 100 MG PO TABS: 100.0000 mg | ORAL_TABLET | Freq: Every day | ORAL | 3 refills | Status: DC

## 2018-02-03 MED ORDER — HYDROCHLOROTHIAZIDE 25 MG PO TABS: 25.0000 mg | ORAL_TABLET | Freq: Every day | ORAL | 3 refills | Status: DC

## 2018-04-23 ENCOUNTER — Other Ambulatory Visit: Payer: Self-pay | Admitting: Family Medicine

## 2018-04-23 ENCOUNTER — Encounter: Payer: Self-pay | Admitting: Family Medicine

## 2018-04-23 DIAGNOSIS — E291 Testicular hypofunction: Secondary | ICD-10-CM

## 2018-04-24 NOTE — Telephone Encounter (Signed)
Ok to refill??  Last office visit 06/03/2017.  Last refill 10/24/2017, #1 refill.

## 2018-05-17 ENCOUNTER — Other Ambulatory Visit: Payer: Self-pay | Admitting: Family Medicine

## 2018-08-27 ENCOUNTER — Other Ambulatory Visit: Payer: Self-pay | Admitting: Family Medicine

## 2018-08-27 DIAGNOSIS — E291 Testicular hypofunction: Secondary | ICD-10-CM

## 2018-08-28 NOTE — Telephone Encounter (Signed)
Requesting refill    Testosterone injection  LOV:  06/03/17  LRF:  04/24/18

## 2018-08-31 ENCOUNTER — Other Ambulatory Visit: Payer: Self-pay | Admitting: Family Medicine

## 2018-08-31 DIAGNOSIS — E291 Testicular hypofunction: Secondary | ICD-10-CM

## 2018-09-01 MED ORDER — TESTOSTERONE CYPIONATE 200 MG/ML IM SOLN: 200.0000 mg | INTRAMUSCULAR | 0 refills | Status: DC

## 2018-09-01 NOTE — Telephone Encounter (Signed)
Requested Prescriptions   Pending Prescriptions Disp Refills  . testosterone cypionate (DEPOTESTOSTERONE CYPIONATE) 200 MG/ML injection 10 mL 0   Last OV 06/03/2017 Last filled 08/28/2018

## 2019-02-01 ENCOUNTER — Other Ambulatory Visit: Payer: Self-pay | Admitting: Family Medicine

## 2019-02-01 DIAGNOSIS — E291 Testicular hypofunction: Secondary | ICD-10-CM

## 2019-02-01 NOTE — Telephone Encounter (Signed)
Ok to refill??  Last office visit 06/03/2017.  Last refill 09/01/2018.  Last lab 06/10/2017.

## 2019-02-21 ENCOUNTER — Other Ambulatory Visit: Payer: Self-pay | Admitting: Family Medicine

## 2019-02-21 MED ORDER — LOSARTAN POTASSIUM 100 MG PO TABS: 100.0000 mg | ORAL_TABLET | Freq: Every day | ORAL | 0 refills | Status: DC

## 2019-02-22 ENCOUNTER — Other Ambulatory Visit: Payer: 59

## 2019-02-22 ENCOUNTER — Other Ambulatory Visit: Payer: Self-pay

## 2019-02-22 DIAGNOSIS — I10 Essential (primary) hypertension: Secondary | ICD-10-CM

## 2019-02-22 DIAGNOSIS — E291 Testicular hypofunction: Secondary | ICD-10-CM

## 2019-02-23 ENCOUNTER — Ambulatory Visit (INDEPENDENT_AMBULATORY_CARE_PROVIDER_SITE_OTHER): Payer: 59 | Admitting: Family Medicine

## 2019-02-23 ENCOUNTER — Encounter: Payer: Self-pay | Admitting: Family Medicine

## 2019-02-23 VITALS — BP 132/88 | HR 88 | Temp 98.2°F | Resp 14 | Ht 65.0 in | Wt 190.0 lb

## 2019-02-23 DIAGNOSIS — R7303 Prediabetes: Secondary | ICD-10-CM

## 2019-02-23 DIAGNOSIS — Z23 Encounter for immunization: Secondary | ICD-10-CM | POA: Diagnosis not present

## 2019-02-23 DIAGNOSIS — Z0000 Encounter for general adult medical examination without abnormal findings: Secondary | ICD-10-CM

## 2019-02-23 DIAGNOSIS — I10 Essential (primary) hypertension: Secondary | ICD-10-CM | POA: Diagnosis not present

## 2019-02-23 DIAGNOSIS — Z0001 Encounter for general adult medical examination with abnormal findings: Secondary | ICD-10-CM | POA: Diagnosis not present

## 2019-02-23 DIAGNOSIS — E291 Testicular hypofunction: Secondary | ICD-10-CM

## 2019-02-23 MED ORDER — SILDENAFIL CITRATE 100 MG PO TABS: 50.0000 mg | ORAL_TABLET | Freq: Every day | ORAL | 11 refills | Status: DC | PRN

## 2019-02-23 NOTE — Addendum Note (Signed)
Addended by: Shary Decamp B on: 02/23/2019 04:45 PM   Modules accepted: Orders

## 2019-02-23 NOTE — Progress Notes (Signed)
Subjective:    Patient ID: Nathaniel Parrish, male    DOB: 10/27/1970, 48 y.o.   MRN: [id number]  Medication Refill  Patient is here today for medication refill along with a complete physical exam.  He is 48 years old.  He has not yet due for a colonoscopy.  He is on testosterone 200 mg every 2 weeks for hypogonadism.  Therefore he is due for a PSA.  His last PSA was checked in March of last year and was normal.  It has not returned yet today on his lab work however he denies any lower urinary tract symptoms.  He currently states that the testosterone is working well.  He denies any decreased libido or fatigue.  He does have some erectile dysfunction and he would like a refill on Viagra.  His blood pressure today is well controlled at 132/88.  He denies any chest pain shortness of breath or dyspnea on exertion.  I reviewed his lab work with him as listed below.  Based on his lab work and his blood pressure today, his 10-year risk of cardiovascular disease is 3-1/2%.  Therefore he does not warrant statin therapy.  He is due for his flu shot today. Lab on 02/22/2019  Component Date Value Ref Range Status  . Cholesterol 02/22/2019 168  <200 mg/dL Final  . HDL 02/22/2019 42  > OR = 40 mg/dL Final  . Triglycerides 02/22/2019 81  <150 mg/dL Final  . LDL Cholesterol (Calc) 02/22/2019 109* mg/dL (calc) Final   Comment: Reference range: <100 . Desirable range <100 mg/dL for primary prevention;   <70 mg/dL for patients with CHD or diabetic patients  with > or = 2 CHD risk factors. Marland Kitchen LDL-C is now calculated using the Martin-Hopkins  calculation, which is a validated novel method providing  better accuracy than the Friedewald equation in the  estimation of LDL-C.  Cresenciano Genre et al. Annamaria Helling. [id number]): 2061-2068  (http://education.[facility].com/faq/FAQ164)   . Total CHOL/HDL Ratio 02/22/2019 4.0  <5.0 (calc) Final  . Non-HDL Cholesterol (Calc) 02/22/2019 126  <130 mg/dL (calc) Final   Comment:  For patients with diabetes plus 1 major ASCVD risk  factor, treating to a non-HDL-C goal of <100 mg/dL  (LDL-C of <70 mg/dL) is considered a therapeutic  option.   . Glucose, Bld 02/22/2019 109* 65 - 99 mg/dL Final   Comment: .            Fasting reference interval . For someone without known diabetes, a glucose value between 100 and 125 mg/dL is consistent with prediabetes and should be confirmed with a follow-up test. .   . BUN 02/22/2019 21  7 - 25 mg/dL Final  . Creat 02/22/2019 1.13  0.60 - 1.35 mg/dL Final  . BUN/Creatinine Ratio [id number] NOT APPLICABLE  6 - 22 (calc) Final  . Sodium 02/22/2019 141  135 - 146 mmol/L Final  . Potassium 02/22/2019 4.2  3.5 - 5.3 mmol/L Final  . Chloride 02/22/2019 100  98 - 110 mmol/L Final  . CO2 02/22/2019 30  20 - 32 mmol/L Final  . Calcium 02/22/2019 9.8  8.6 - 10.3 mg/dL Final  . Total Protein 02/22/2019 7.0  6.1 - 8.1 g/dL Final  . Albumin 02/22/2019 4.5  3.6 - 5.1 g/dL Final  . Globulin 02/22/2019 2.5  1.9 - 3.7 g/dL (calc) Final  . AG Ratio 02/22/2019 1.8  1.0 - 2.5 (calc) Final  . Total Bilirubin 02/22/2019 0.7  0.2 - 1.2 mg/dL Final  .  Alkaline phosphatase (APISO) 02/22/2019 50  36 - 130 U/L Final  . AST 02/22/2019 16  10 - 40 U/L Final  . ALT 02/22/2019 25  9 - 46 U/L Final  . WBC 02/22/2019 7.4  3.8 - 10.8 Thousand/uL Final  . RBC 02/22/2019 5.50  4.20 - 5.80 Million/uL Final  . Hemoglobin 02/22/2019 16.6  13.2 - 17.1 g/dL Final  . HCT 02/22/2019 49.1  38.5 - 50.0 % Final  . MCV 02/22/2019 89.3  80.0 - 100.0 fL Final  . MCH 02/22/2019 30.2  27.0 - 33.0 pg Final  . MCHC 02/22/2019 33.8  32.0 - 36.0 g/dL Final  . RDW 02/22/2019 12.8  11.0 - 15.0 % Final  . Platelets 02/22/2019 298  140 - 400 Thousand/uL Final  . MPV 02/22/2019 9.6  7.5 - 12.5 fL Final  . Neutro Abs 02/22/2019 4,329  1,500 - 7,800 cells/uL Final  . Lymphs Abs 02/22/2019 2,227  850 - 3,900 cells/uL Final  . Absolute Monocytes 02/22/2019 644  200 - 950 cells/uL  Final  . Eosinophils Absolute 02/22/2019 148  15 - 500 cells/uL Final  . Basophils Absolute 02/22/2019 52  0 - 200 cells/uL Final  . Neutrophils Relative % 02/22/2019 58.5  % Final  . Total Lymphocyte 02/22/2019 30.1  % Final  . Monocytes Relative 02/22/2019 8.7  % Final  . Eosinophils Relative 02/22/2019 2.0  % Final  . Basophils Relative 02/22/2019 0.7  % Final  . Testosterone 02/22/2019 443  250 - 827 ng/dL Final    Past Medical History:  Diagnosis Date  . Hypertension   . Hypogonadism in male   . Low testosterone   . Prediabetes    Past Surgical History:  Procedure Laterality Date  . NASAL SEPTUM SURGERY     Current Outpatient Medications on File Prior to Visit  Medication Sig Dispense Refill  . hydrochlorothiazide (HYDRODIURIL) 25 MG tablet TAKE 1 TABLET BY MOUTH EVERY DAY 90 tablet 3  . losartan (COZAAR) 100 MG tablet Take 1 tablet (100 mg total) by mouth daily. (Needs office visit and labs before further refills) 90 tablet 0  . testosterone cypionate (DEPOTESTOSTERONE CYPIONATE) 200 MG/ML injection Inject 1 mL (200 mg total) into the muscle every 14 (fourteen) days. 10 mL 0   No current facility-administered medications on file prior to visit.    No Known Allergies Social History   Socioeconomic History  . Marital status: Single    Spouse name: Not on file  . Number of children: Not on file  . Years of education: Not on file  . Highest education level: Not on file  Occupational History  . Not on file  Social Needs  . Financial resource strain: Not on file  . Food insecurity    Worry: Not on file    Inability: Not on file  . Transportation needs    Medical: Not on file    Non-medical: Not on file  Tobacco Use  . Smoking status: Never Smoker  . Smokeless tobacco: Current User    Types: Chew  Substance and Sexual Activity  . Alcohol use: No  . Drug use: No  . Sexual activity: Yes    Comment: married  Lifestyle  . Physical activity    Days per week: Not  on file    Minutes per session: Not on file  . Stress: Not on file  Relationships  . Social Herbalist on phone: Not on file    Gets together:  Not on file    Attends religious service: Not on file    Active member of club or organization: Not on file    Attends meetings of clubs or organizations: Not on file    Relationship status: Not on file  . Intimate partner violence    Fear of current or ex partner: Not on file    Emotionally abused: Not on file    Physically abused: Not on file    Forced sexual activity: Not on file  Other Topics Concern  . Not on file  Social History Narrative  . Not on file     Review of Systems  All other systems reviewed and are negative.      Objective:   Physical Exam  Constitutional: He is oriented to person, place, and time. He appears well-developed and well-nourished. No distress.  HENT:  Head: Normocephalic and atraumatic.  Right Ear: External ear normal.  Left Ear: External ear normal.  Nose: Nose normal.  Mouth/Throat: Oropharynx is clear and moist. No oropharyngeal exudate.  Eyes: Pupils are equal, round, and reactive to light. Conjunctivae and EOM are normal. Right eye exhibits no discharge. Left eye exhibits no discharge. No scleral icterus.  Neck: Neck supple. No JVD present. No thyromegaly present.  Cardiovascular: Normal rate, regular rhythm, normal heart sounds and intact distal pulses.  No murmur heard. Pulmonary/Chest: Effort normal and breath sounds normal. No respiratory distress. He has no wheezes. He has no rales. He exhibits no tenderness.  Abdominal: Soft. Bowel sounds are normal. He exhibits no distension and no mass. There is no abdominal tenderness. There is no rebound and no guarding.  Musculoskeletal:        General: No edema.  Lymphadenopathy:    He has no cervical adenopathy.  Neurological: He is alert and oriented to person, place, and time. He displays normal reflexes. No cranial nerve deficit. He  exhibits normal muscle tone. Coordination normal.  Skin: Skin is warm. Rash noted. He is not diaphoretic.  Psychiatric: He has a normal mood and affect. His behavior is normal. Judgment and thought content normal.  Vitals reviewed. Patient has numerous actinic keratoses on his scalp and forehead.  He sees dermatology every 6 months.        Assessment & Plan:  General medical exam  Essential hypertension  Hypogonadism, male  Prediabetes  We discussed prediabetes.  Patient's blood sugar is elevated and borderline prediabetic.  I recommend a low carbohydrate diet.  His blood pressure today is well controlled.  Cholesterol puts him at 3.5% risk for cardiovascular disease over the next 10 years and therefore I would not recommend a statin.  I will check his PSA.  The patient received his flu shot today.  Recommended wearing a wide brim hat when he is outside to prevent progression of his actinic keratoses on his forehead and scalp.  Regular anticipatory guidance is provided.

## 2019-02-24 LAB — TEST AUTHORIZATION

## 2019-02-24 LAB — COMPREHENSIVE METABOLIC PANEL
AG Ratio: 1.8 (calc) (ref 1.0–2.5)
ALT: 25 U/L (ref 9–46)
AST: 16 U/L (ref 10–40)
Albumin: 4.5 g/dL (ref 3.6–5.1)
Alkaline phosphatase (APISO): 50 U/L (ref 36–130)
BUN: 21 mg/dL (ref 7–25)
CO2: 30 mmol/L (ref 20–32)
Calcium: 9.8 mg/dL (ref 8.6–10.3)
Chloride: 100 mmol/L (ref 98–110)
Creat: 1.13 mg/dL (ref 0.60–1.35)
Globulin: 2.5 g/dL (calc) (ref 1.9–3.7)
Glucose, Bld: 109 mg/dL — ABNORMAL HIGH (ref 65–99)
Potassium: 4.2 mmol/L (ref 3.5–5.3)
Sodium: 141 mmol/L (ref 135–146)
Total Bilirubin: 0.7 mg/dL (ref 0.2–1.2)
Total Protein: 7 g/dL (ref 6.1–8.1)

## 2019-02-24 LAB — CBC WITH DIFFERENTIAL/PLATELET
Absolute Monocytes: 644 cells/uL (ref 200–950)
Basophils Absolute: 52 cells/uL (ref 0–200)
Basophils Relative: 0.7 %
Eosinophils Absolute: 148 cells/uL (ref 15–500)
Eosinophils Relative: 2 %
HCT: 49.1 % (ref 38.5–50.0)
Hemoglobin: 16.6 g/dL (ref 13.2–17.1)
Lymphs Abs: 2227 cells/uL (ref 850–3900)
MCH: 30.2 pg (ref 27.0–33.0)
MCHC: 33.8 g/dL (ref 32.0–36.0)
MCV: 89.3 fL (ref 80.0–100.0)
MPV: 9.6 fL (ref 7.5–12.5)
Monocytes Relative: 8.7 %
Neutro Abs: 4329 cells/uL (ref 1500–7800)
Neutrophils Relative %: 58.5 %
Platelets: 298 10*3/uL (ref 140–400)
RBC: 5.5 10*6/uL (ref 4.20–5.80)
RDW: 12.8 % (ref 11.0–15.0)
Total Lymphocyte: 30.1 %
WBC: 7.4 10*3/uL (ref 3.8–10.8)

## 2019-02-24 LAB — PSA: PSA: 1.4 ng/mL (ref <=4.0)

## 2019-02-24 LAB — LIPID PANEL
Cholesterol: 168 mg/dL (ref <200)
HDL: 42 mg/dL (ref >=40)
LDL Cholesterol (Calc): 109 mg/dL (calc) — ABNORMAL HIGH
Non-HDL Cholesterol (Calc): 126 mg/dL (calc) (ref <130)
Total CHOL/HDL Ratio: 4 (calc) (ref <5.0)
Triglycerides: 81 mg/dL (ref <150)

## 2019-02-24 LAB — TESTOSTERONE: Testosterone: 443 ng/dL (ref 250–827)

## 2019-05-18 ENCOUNTER — Other Ambulatory Visit: Payer: Self-pay | Admitting: Family Medicine

## 2019-07-31 ENCOUNTER — Other Ambulatory Visit: Payer: Self-pay | Admitting: Family Medicine

## 2019-07-31 DIAGNOSIS — E291 Testicular hypofunction: Secondary | ICD-10-CM

## 2019-07-31 NOTE — Telephone Encounter (Signed)
Requested Prescriptions   Pending Prescriptions Disp Refills  . testosterone cypionate (DEPOTESTOSTERONE CYPIONATE) 200 MG/ML injection [Pharmacy Med Name: TESTOSTERONE CYP 200MG /ML MDV 10ML] 10 mL     Sig: INJECT 1 ML INTO THE MUSCLE EVERY 14 DAYS     Last OV 02/23/2019   Last written 09/01/2018

## 2019-10-16 ENCOUNTER — Ambulatory Visit (INDEPENDENT_AMBULATORY_CARE_PROVIDER_SITE_OTHER): Payer: 59 | Admitting: Family Medicine

## 2019-10-16 ENCOUNTER — Other Ambulatory Visit: Payer: Self-pay

## 2019-10-16 VITALS — BP 110/82 | HR 74 | Temp 98.4°F | Ht 65.0 in | Wt 186.0 lb

## 2019-10-16 DIAGNOSIS — M6208 Separation of muscle (nontraumatic), other site: Secondary | ICD-10-CM | POA: Diagnosis not present

## 2019-10-16 NOTE — Progress Notes (Signed)
Subjective:    Patient ID: Nathaniel Parrish, male    DOB: 1970/09/23, 49 y.o.   MRN: [id number]  Patient presents today concerned that he may have a hernia in his abdomen.  A week ago he was playing in the floor with his dog wrestling.  He was sitting up while holding onto the dog and noticed a bulge in the midline of his abdomen superior to the umbilicus.  Is an elliptical shaped midline bulge.  He denies any pain.  He denies any change in his bowel movements.  He denies any melena or hematochezia.  He only sees the bulge when he sits up.  He does not see the bulge when he is standing.  There is no pain with exercise. Past Medical History:  Diagnosis Date  . Hypertension   . Hypogonadism in male   . Low testosterone   . Prediabetes    Past Surgical History:  Procedure Laterality Date  . NASAL SEPTUM SURGERY     Current Outpatient Medications on File Prior to Visit  Medication Sig Dispense Refill  . hydrochlorothiazide (HYDRODIURIL) 25 MG tablet TAKE 1 TABLET BY MOUTH EVERY DAY 90 tablet 3  . losartan (COZAAR) 100 MG tablet TAKE 1 TABLET BY MOUTH EVERY DAY NEEDS OFFICE VISIT 90 tablet 3  . sildenafil (VIAGRA) 100 MG tablet Take 0.5-1 tablets (50-100 mg total) by mouth daily as needed for erectile dysfunction. 5 tablet 11  . testosterone cypionate (DEPOTESTOSTERONE CYPIONATE) 200 MG/ML injection INJECT 1 ML INTO THE MUSCLE EVERY 14 DAYS 10 mL 2   No current facility-administered medications on file prior to visit.   No Known Allergies Social History   Socioeconomic History  . Marital status: Single    Spouse name: Not on file  . Number of children: Not on file  . Years of education: Not on file  . Highest education level: Not on file  Occupational History  . Not on file  Tobacco Use  . Smoking status: Never Smoker  . Smokeless tobacco: Current User    Types: Chew  Substance and Sexual Activity  . Alcohol use: No  . Drug use: No  . Sexual activity: Yes    Comment: married   Other Topics Concern  . Not on file  Social History Narrative  . Not on file   Social Determinants of Health   Financial Resource Strain:   . Difficulty of Paying Living Expenses:   Food Insecurity:   . Worried About Charity fundraiser in the Last Year:   . Arboriculturist in the Last Year:   Transportation Needs:   . Film/video editor (Medical):   Marland Kitchen Lack of Transportation (Non-Medical):   Physical Activity:   . Days of Exercise per Week:   . Minutes of Exercise per Session:   Stress:   . Feeling of Stress :   Social Connections:   . Frequency of Communication with Friends and Family:   . Frequency of Social Gatherings with Friends and Family:   . Attends Religious Services:   . Active Member of Clubs or Organizations:   . Attends Archivist Meetings:   Marland Kitchen Marital Status:   Intimate Partner Violence:   . Fear of Current or Ex-Partner:   . Emotionally Abused:   Marland Kitchen Physically Abused:   . Sexually Abused:      Review of Systems  All other systems reviewed and are negative.      Objective:   Physical  Exam Vitals reviewed.  Constitutional:      Appearance: He is well-developed.  Neck:     Thyroid: No thyromegaly.     Vascular: No JVD.  Cardiovascular:     Rate and Rhythm: Normal rate and regular rhythm.     Heart sounds: Normal heart sounds. No murmur heard.   Pulmonary:     Effort: Pulmonary effort is normal. No respiratory distress.     Breath sounds: Normal breath sounds. No wheezing or rales.  Chest:     Chest wall: No tenderness.  Abdominal:     General: Bowel sounds are normal. There is no distension.     Palpations: Abdomen is soft. There is no mass or pulsatile mass.     Tenderness: There is no abdominal tenderness. There is no guarding or rebound.    Musculoskeletal:     Cervical back: Neck supple.  Lymphadenopathy:     Cervical: No cervical adenopathy.           Assessment & Plan:  Diastasis recti  Fortunately this does  not appear to be a ventral hernia.  Instead it appears and feels like a diastasis recti.  I did the best I could to explain the difference.  Patient is comfortable with the explanation.  No treatment is necessary at this time.  If he does start to feel a asymmetric bulge or have pain associated with it, we could evaluate further for the presence of a ventral hernia however his exam today suggests diastasis recti

## 2020-03-03 ENCOUNTER — Other Ambulatory Visit: Payer: Self-pay | Admitting: Family Medicine

## 2020-03-03 NOTE — Telephone Encounter (Signed)
Ok to refill 

## 2020-05-13 ENCOUNTER — Other Ambulatory Visit: Payer: Self-pay | Admitting: Family Medicine

## 2020-06-29 ENCOUNTER — Other Ambulatory Visit: Payer: Self-pay | Admitting: Family Medicine

## 2020-06-29 DIAGNOSIS — E291 Testicular hypofunction: Secondary | ICD-10-CM

## 2020-09-19 ENCOUNTER — Other Ambulatory Visit: Payer: 59

## 2020-09-19 ENCOUNTER — Other Ambulatory Visit: Payer: Self-pay

## 2020-09-19 DIAGNOSIS — E291 Testicular hypofunction: Secondary | ICD-10-CM

## 2020-09-19 DIAGNOSIS — R7303 Prediabetes: Secondary | ICD-10-CM

## 2020-09-19 DIAGNOSIS — Z125 Encounter for screening for malignant neoplasm of prostate: Secondary | ICD-10-CM

## 2020-09-19 DIAGNOSIS — I10 Essential (primary) hypertension: Secondary | ICD-10-CM

## 2020-09-19 DIAGNOSIS — Z136 Encounter for screening for cardiovascular disorders: Secondary | ICD-10-CM

## 2020-09-19 DIAGNOSIS — Z13220 Encounter for screening for lipoid disorders: Secondary | ICD-10-CM

## 2020-09-19 DIAGNOSIS — Z1159 Encounter for screening for other viral diseases: Secondary | ICD-10-CM

## 2020-09-22 LAB — CBC WITH DIFFERENTIAL/PLATELET
Absolute Monocytes: 738 cells/uL (ref 200–950)
Basophils Absolute: 57 cells/uL (ref 0–200)
Basophils Relative: 0.7 %
Eosinophils Absolute: 197 cells/uL (ref 15–500)
Eosinophils Relative: 2.4 %
HCT: 46.7 % (ref 38.5–50.0)
Hemoglobin: 15.9 g/dL (ref 13.2–17.1)
Lymphs Abs: 2657 cells/uL (ref 850–3900)
MCH: 30.6 pg (ref 27.0–33.0)
MCHC: 34 g/dL (ref 32.0–36.0)
MCV: 89.8 fL (ref 80.0–100.0)
MPV: 9.7 fL (ref 7.5–12.5)
Monocytes Relative: 9 %
Neutro Abs: 4551 cells/uL (ref 1500–7800)
Neutrophils Relative %: 55.5 %
Platelets: 265 10*3/uL (ref 140–400)
RBC: 5.2 10*6/uL (ref 4.20–5.80)
RDW: 12.6 % (ref 11.0–15.0)
Total Lymphocyte: 32.4 %
WBC: 8.2 10*3/uL (ref 3.8–10.8)

## 2020-09-22 LAB — LIPID PANEL
Cholesterol: 156 mg/dL (ref <200)
HDL: 47 mg/dL (ref >=40)
LDL Cholesterol (Calc): 88 mg/dL (calc)
Non-HDL Cholesterol (Calc): 109 mg/dL (calc) (ref <130)
Total CHOL/HDL Ratio: 3.3 (calc) (ref <5.0)
Triglycerides: 118 mg/dL (ref <150)

## 2020-09-22 LAB — MICROALBUMIN / CREATININE URINE RATIO
Creatinine, Urine: 71 mg/dL (ref 20–320)
Microalb Creat Ratio: 4 mcg/mg creat (ref <30)
Microalb, Ur: 0.3 mg/dL

## 2020-09-22 LAB — COMPLETE METABOLIC PANEL WITH GFR
AG Ratio: 1.7 (calc) (ref 1.0–2.5)
ALT: 29 U/L (ref 9–46)
AST: 17 U/L (ref 10–35)
Albumin: 4.4 g/dL (ref 3.6–5.1)
Alkaline phosphatase (APISO): 59 U/L (ref 35–144)
BUN: 22 mg/dL (ref 7–25)
CO2: 25 mmol/L (ref 20–32)
Calcium: 9.9 mg/dL (ref 8.6–10.3)
Chloride: 101 mmol/L (ref 98–110)
Creat: 0.98 mg/dL (ref 0.70–1.33)
GFR, Est African American: 103 mL/min/{1.73_m2} (ref >=60)
GFR, Est Non African American: 89 mL/min/{1.73_m2} (ref >=60)
Globulin: 2.6 g/dL (calc) (ref 1.9–3.7)
Glucose, Bld: 108 mg/dL — ABNORMAL HIGH (ref 65–99)
Potassium: 4.3 mmol/L (ref 3.5–5.3)
Sodium: 138 mmol/L (ref 135–146)
Total Bilirubin: 0.6 mg/dL (ref 0.2–1.2)
Total Protein: 7 g/dL (ref 6.1–8.1)

## 2020-09-22 LAB — HEMOGLOBIN A1C
Hgb A1c MFr Bld: 6.1 % of total Hgb — ABNORMAL HIGH (ref <5.7)
Mean Plasma Glucose: 128 mg/dL
eAG (mmol/L): 7.1 mmol/L

## 2020-09-22 LAB — HEPATITIS C ANTIBODY
Hepatitis C Ab: NONREACTIVE
SIGNAL TO CUT-OFF: 0 (ref <1.00)

## 2020-09-22 LAB — TESTOSTERONE: Testosterone: 268 ng/dL (ref 250–827)

## 2020-09-22 LAB — PSA: PSA: 1.76 ng/mL (ref <=4.00)

## 2020-09-26 ENCOUNTER — Other Ambulatory Visit: Payer: Self-pay

## 2020-09-26 ENCOUNTER — Encounter: Payer: Self-pay | Admitting: Family Medicine

## 2020-09-26 ENCOUNTER — Ambulatory Visit (INDEPENDENT_AMBULATORY_CARE_PROVIDER_SITE_OTHER): Payer: 59 | Admitting: Family Medicine

## 2020-09-26 VITALS — BP 132/80 | HR 86 | Temp 98.1°F | Resp 14 | Ht 65.0 in | Wt 182.0 lb

## 2020-09-26 DIAGNOSIS — Z0000 Encounter for general adult medical examination without abnormal findings: Secondary | ICD-10-CM

## 2020-09-26 DIAGNOSIS — Z0001 Encounter for general adult medical examination with abnormal findings: Secondary | ICD-10-CM

## 2020-09-26 DIAGNOSIS — E291 Testicular hypofunction: Secondary | ICD-10-CM | POA: Diagnosis not present

## 2020-09-26 DIAGNOSIS — Z1211 Encounter for screening for malignant neoplasm of colon: Secondary | ICD-10-CM | POA: Diagnosis not present

## 2020-09-26 DIAGNOSIS — I10 Essential (primary) hypertension: Secondary | ICD-10-CM | POA: Diagnosis not present

## 2020-09-26 DIAGNOSIS — R7303 Prediabetes: Secondary | ICD-10-CM

## 2020-09-26 MED ORDER — "SYRINGE/NEEDLE (DISP) 25G X 1"" 3 ML MISC": 11 refills | Status: DC

## 2020-09-26 MED ORDER — TESTOSTERONE CYPIONATE 200 MG/ML IM SOLN: 100.0000 mg | INTRAMUSCULAR | 3 refills | Status: DC

## 2020-09-26 NOTE — Progress Notes (Signed)
Subjective:    Patient ID: Nathaniel Parrish, male    DOB: 1970/08/20, 50 y.o.   MRN: [id number]  Patient is here today for complete physical exam.  He is due for colonoscopy.  He would also like to resume his testosterone.  He quit this a couple months ago and he has developed profound fatigue ever since.  He states that he feels extremely weak and tired after about 1 or 2:00 in the afternoon.  He does not have the stamina that he had while he was on the testosterone.  He denies any chest pain shortness of breath or dyspnea on exertion.  Most recent lab work is listed below.  Testosterone is borderline low.  Hemoglobin A1c shows prediabetes.  PSA is stable.  Cholesterol is excellent. Lab on 09/19/2020  Component Date Value Ref Range Status   WBC 09/19/2020 8.2  3.8 - 10.8 Thousand/uL Final   RBC 09/19/2020 5.20  4.20 - 5.80 Million/uL Final   Hemoglobin 09/19/2020 15.9  13.2 - 17.1 g/dL Final   HCT 09/19/2020 46.7  38.5 - 50.0 % Final   MCV 09/19/2020 89.8  80.0 - 100.0 fL Final   MCH 09/19/2020 30.6  27.0 - 33.0 pg Final   MCHC 09/19/2020 34.0  32.0 - 36.0 g/dL Final   RDW 09/19/2020 12.6  11.0 - 15.0 % Final   Platelets 09/19/2020 265  140 - 400 Thousand/uL Final   MPV 09/19/2020 9.7  7.5 - 12.5 fL Final   Neutro Abs 09/19/2020 4,551  1,500 - 7,800 cells/uL Final   Lymphs Abs 09/19/2020 2,657  850 - 3,900 cells/uL Final   Absolute Monocytes 09/19/2020 738  200 - 950 cells/uL Final   Eosinophils Absolute 09/19/2020 197  15 - 500 cells/uL Final   Basophils Absolute 09/19/2020 57  0 - 200 cells/uL Final   Neutrophils Relative % 09/19/2020 55.5  % Final   Total Lymphocyte 09/19/2020 32.4  % Final   Monocytes Relative 09/19/2020 9.0  % Final   Eosinophils Relative 09/19/2020 2.4  % Final   Basophils Relative 09/19/2020 0.7  % Final   Glucose, Bld 09/19/2020 108 (A) 65 - 99 mg/dL Final   Comment: .            Fasting reference interval . For someone without known diabetes, a glucose  value between 100 and 125 mg/dL is consistent with prediabetes and should be confirmed with a follow-up test. .    BUN 09/19/2020 22  7 - 25 mg/dL Final   Creat 09/19/2020 0.98  0.70 - 1.33 mg/dL Final   Comment: For patients >83 years of age, the reference limit for Creatinine is approximately 13% higher for people identified as African-American. .    GFR, Est Non African American 09/19/2020 89  > OR = 60 mL/min/1.90m2 Final   GFR, Est African American 09/19/2020 103  > OR = 60 mL/min/1.61m2 Final   BUN/Creatinine Ratio 43/15/4008 NOT APPLICABLE  6 - 22 (calc) Final   Sodium 09/19/2020 138  135 - 146 mmol/L Final   Potassium 09/19/2020 4.3  3.5 - 5.3 mmol/L Final   Chloride 09/19/2020 101  98 - 110 mmol/L Final   CO2 09/19/2020 25  20 - 32 mmol/L Final   Calcium 09/19/2020 9.9  8.6 - 10.3 mg/dL Final   Total Protein 09/19/2020 7.0  6.1 - 8.1 g/dL Final   Albumin 09/19/2020 4.4  3.6 - 5.1 g/dL Final   Globulin 09/19/2020 2.6  1.9 - 3.7 g/dL (calc)  Final   AG Ratio 09/19/2020 1.7  1.0 - 2.5 (calc) Final   Total Bilirubin 09/19/2020 0.6  0.2 - 1.2 mg/dL Final   Alkaline phosphatase (APISO) 09/19/2020 59  35 - 144 U/L Final   AST 09/19/2020 17  10 - 35 U/L Final   ALT 09/19/2020 29  9 - 46 U/L Final   Hgb A1c MFr Bld 09/19/2020 6.1 (A) <5.7 % of total Hgb Final   Comment: For someone without known diabetes, a hemoglobin  A1c value between 5.7% and 6.4% is consistent with prediabetes and should be confirmed with a  follow-up test. . For someone with known diabetes, a value <7% indicates that their diabetes is well controlled. A1c targets should be individualized based on duration of diabetes, age, comorbid conditions, and other considerations. . This assay result is consistent with an increased risk of diabetes. . Currently, no consensus exists regarding use of hemoglobin A1c for diagnosis of diabetes for children. .    Mean Plasma Glucose 09/19/2020 128  mg/dL Final    eAG (mmol/L) 09/19/2020 7.1  mmol/L Final   Hepatitis C Ab 09/19/2020 NON-REACTIVE  NON-REACTIVE Final   SIGNAL TO CUT-OFF 09/19/2020 0.00  <1.00 Final   Comment: . HCV antibody was non-reactive. There is no laboratory  evidence of HCV infection. . In most cases, no further action is required. However, if recent HCV exposure is suspected, a test for HCV RNA (test code [phone]) is suggested. . For additional information please refer to http://education.[facility].com/faq/FAQ22v1 (This link is being provided for informational/ educational purposes only.) .    Cholesterol 09/19/2020 156  <200 mg/dL Final   HDL 09/19/2020 47  > OR = 40 mg/dL Final   Triglycerides 09/19/2020 118  <150 mg/dL Final   LDL Cholesterol (Calc) 09/19/2020 88  mg/dL (calc) Final   Comment: Reference range: <100 . Desirable range <100 mg/dL for primary prevention;   <70 mg/dL for patients with CHD or diabetic patients  with > or = 2 CHD risk factors. Marland Kitchen LDL-C is now calculated using the Martin-Hopkins  calculation, which is a validated novel method providing  better accuracy than the Friedewald equation in the  estimation of LDL-C.  Cresenciano Genre et al. Annamaria Helling. 3710;626(94): 2061-2068  (http://education.[facility].com/faq/FAQ164)    Total CHOL/HDL Ratio 09/19/2020 3.3  <5.0 (calc) Final   Non-HDL Cholesterol (Calc) 09/19/2020 109  <130 mg/dL (calc) Final   Comment: For patients with diabetes plus 1 major ASCVD risk  factor, treating to a non-HDL-C goal of <100 mg/dL  (LDL-C of <70 mg/dL) is considered a therapeutic  option.    Creatinine, Urine 09/19/2020 71  20 - 320 mg/dL Final   Microalb, Ur 09/19/2020 0.3  mg/dL Final   Comment: Reference Range Not established    Microalb Creat Ratio 09/19/2020 4  <30 mcg/mg creat Final   Comment: . The ADA defines abnormalities in albumin excretion as follows: Marland Kitchen Albuminuria Category        Result (mcg/mg creatinine) . Normal to Mildly increased    <30 Moderately increased         30-299  Severely increased           > OR = 300 . The ADA recommends that at least two of three specimens collected within a 3-6 month period be abnormal before considering a patient to be within a diagnostic category.    PSA 09/19/2020 1.76  < OR = 4.00 ng/mL Final   Comment: The total PSA value from this assay system  is  standardized against the WHO standard. The test  result will be approximately 20% lower when compared  to the equimolar-standardized total PSA (Beckman  Coulter). Comparison of serial PSA results should be  interpreted with this fact in mind. . This test was performed using the Siemens  chemiluminescent method. Values obtained from  different assay methods cannot be used interchangeably. PSA levels, regardless of value, should not be interpreted as absolute evidence of the presence or absence of disease.    Testosterone 09/19/2020 268  250 - 827 ng/dL Final    Past Medical History:  Diagnosis Date   Hypertension    Hypogonadism in male    Low testosterone    Prediabetes    Past Surgical History:  Procedure Laterality Date   NASAL SEPTUM SURGERY     Current Outpatient Medications on File Prior to Visit  Medication Sig Dispense Refill   hydrochlorothiazide (HYDRODIURIL) 25 MG tablet TAKE 1 TABLET BY MOUTH EVERY DAY 90 tablet 3   losartan (COZAAR) 100 MG tablet TAKE 1 TABLET BY MOUTH EVERY DAY NEEDS OFFICE VISIT 90 tablet 3   sildenafil (VIAGRA) 100 MG tablet TAKE 1/2 TO 1 TABLET(50 TO 100 MG) BY MOUTH DAILY AS NEEDED FOR ERECTILE DYSFUNCTION 5 tablet 2   No current facility-administered medications on file prior to visit.   No Known Allergies Social History   Socioeconomic History   Marital status: Single    Spouse name: Not on file   Number of children: Not on file   Years of education: Not on file   Highest education level: Not on file  Occupational History   Not on file  Tobacco Use   Smoking status: Never    Smokeless tobacco: Current    Types: Chew  Substance and Sexual Activity   Alcohol use: No   Drug use: No   Sexual activity: Yes    Comment: married  Other Topics Concern   Not on file  Social History Narrative   Not on file   Social Determinants of Health   Financial Resource Strain: Not on file  Food Insecurity: Not on file  Transportation Needs: Not on file  Physical Activity: Not on file  Stress: Not on file  Social Connections: Not on file  Intimate Partner Violence: Not on file     Review of Systems  All other systems reviewed and are negative.     Objective:   Physical Exam Vitals reviewed.  Constitutional:      General: He is not in acute distress.    Appearance: He is well-developed. He is not diaphoretic.  HENT:     Head: Normocephalic and atraumatic.     Right Ear: External ear normal.     Left Ear: External ear normal.     Nose: Nose normal.     Mouth/Throat:     Pharynx: No oropharyngeal exudate.  Eyes:     General: No scleral icterus.       Right eye: No discharge.        Left eye: No discharge.     Conjunctiva/sclera: Conjunctivae normal.     Pupils: Pupils are equal, round, and reactive to light.  Neck:     Thyroid: No thyromegaly.     Vascular: No JVD.  Cardiovascular:     Rate and Rhythm: Normal rate and regular rhythm.     Heart sounds: Normal heart sounds. No murmur heard. Pulmonary:     Effort: Pulmonary effort is normal. No respiratory distress.  Breath sounds: Normal breath sounds. No wheezing or rales.  Chest:     Chest wall: No tenderness.  Abdominal:     General: Bowel sounds are normal. There is no distension.     Palpations: Abdomen is soft. There is no mass.     Tenderness: There is no abdominal tenderness. There is no guarding or rebound.  Musculoskeletal:     Cervical back: Neck supple.  Lymphadenopathy:     Cervical: No cervical adenopathy.  Skin:    General: Skin is warm.     Findings: Rash present.   Neurological:     Mental Status: He is alert and oriented to person, place, and time.     Cranial Nerves: No cranial nerve deficit.     Motor: No abnormal muscle tone.     Coordination: Coordination normal.     Deep Tendon Reflexes: Reflexes normal.  Psychiatric:        Behavior: Behavior normal.        Thought Content: Thought content normal.        Judgment: Judgment normal.  Patient has numerous actinic keratoses on his scalp and forehead.  He sees dermatology every 6 months.        Assessment & Plan:  Colon cancer screening - Plan: SYRINGE-NEEDLE, DISP, 3 ML 25G X 1" 3 ML MISC, Ambulatory referral to Gastroenterology  Hypogonadism in male  General medical exam  Essential hypertension  Prediabetes Schedule the patient for a colonoscopy.  Start the patient back on testosterone 100 mg IM every 2 weeks.  Recheck labs in 6 months including PSA, CBC, CMP, lipid panel, and hemoglobin A1c.  Blood pressure today is acceptable.  Recommended a booster on his COVID vaccination.  The remainder of his preventative care is up-to-date

## 2020-10-01 ENCOUNTER — Encounter: Payer: Self-pay | Admitting: Gastroenterology

## 2020-10-08 ENCOUNTER — Other Ambulatory Visit: Payer: Self-pay

## 2020-10-08 ENCOUNTER — Ambulatory Visit (AMBULATORY_SURGERY_CENTER): Payer: 59 | Admitting: *Deleted

## 2020-10-08 VITALS — Ht 65.0 in | Wt 185.0 lb

## 2020-10-08 DIAGNOSIS — Z1211 Encounter for screening for malignant neoplasm of colon: Secondary | ICD-10-CM

## 2020-10-08 MED ORDER — PEG-KCL-NACL-NASULF-NA ASC-C 100 G PO SOLR: 1.0000 | Freq: Once | ORAL | 0 refills | Status: AC

## 2020-10-08 NOTE — Progress Notes (Signed)
No egg or soy allergy known to patient  No issues with past sedation with any surgeries or procedures Patient denies ever being told they had issues or difficulty with intubation  No FH of Malignant Hyperthermia No diet pills per patient No home 02 use per patient  No blood thinners per patient  Pt denies issues with constipation  No A fib or A flutter  EMMI video to pt or via MyChart  COVID 19 guidelines implemented in [facility] today with Pt and RN  Pt is fully vaccinated  for Covid    Discussed with pt there will be an out-of-pocket cost for prep and that varies from $0 to 70 dollars   Due to the COVID-19 pandemic we are asking patients to follow certain guidelines.  Pt aware of COVID protocols and [facility] guidelines   

## 2020-10-20 ENCOUNTER — Other Ambulatory Visit: Payer: Self-pay

## 2020-10-20 ENCOUNTER — Ambulatory Visit
Admission: EM | Admit: 2020-10-20 | Discharge: 2020-10-20 | Disposition: A | Discharge status: Home / Self Care | Payer: 59 | Attending: Emergency Medicine | Admitting: Emergency Medicine

## 2020-10-20 ENCOUNTER — Encounter: Payer: Self-pay | Admitting: Emergency Medicine

## 2020-10-20 DIAGNOSIS — B349 Viral infection, unspecified: Secondary | ICD-10-CM

## 2020-10-20 DIAGNOSIS — R0981 Nasal congestion: Secondary | ICD-10-CM | POA: Diagnosis not present

## 2020-10-20 NOTE — ED Triage Notes (Addendum)
Patient c/o nasal congestion and nausea x 1 day.   Patient denies fever at home. Patient denies ABD pain, emesis,or diarrhea.   Patient denies any at home COVID-19 testing.   Patient endorses "being clammy". Patient endorses generalized body aches and fatigue.   Patient hasn't taken any medications for symptoms.

## 2020-10-20 NOTE — Discharge Instructions (Addendum)
We will call you with any positive results from your COVID-19 testing completed in clinic today.  If you do not receive a phone call from [place] within the next 3-5 days, check your MyChart for up-to-date health information related to testing completed in clinic today.   For most people this is a self-limiting process and can take anywhere from 7 - 10 days to start feeling better. A cough can last up to 3 weeks. Pay special attention to handwashing as this can help prevent the spread of the virus.   Always read the labels of cough and cold medications as they may contain some of the ingredients below.  Rest, push lots of fluids (especially water), and utilize supportive care for symptoms. You may take acetaminophen (Tylenol) every 4-6 hours and ibuprofen every 6-8 hours for muscle pain, joint pain, headaches (you may also alternate these medications). Mucinex (guaifenesin) may be taken over the counter for cough as needed can loosen phlegm. Please read the instructions and take as directed.  Sudafed (pseudophedrine) is sold behind the counter and can help reduce nasal pressure; avoid taking this if you have high blood pressure or feel jittery. Sudafed PE (phenylephrine) can be a helpful, short-term, over-the-counter alternative to limit side effects or if you have high blood pressure.  Flonase nasal spray can help alleviate congestion and sinus pressure. Many patients choose Afrin as a nasal decongestant; do not use for more than 3 days for risk of rebound (increased symptoms after stopping medication).  Saline nasal sprays or rinses can also help nasal congestion (use bottled or sterile water). Warm tea with lemon and honey can sooth sore throat and cough, as can cough drops.   Return to clinic for high fever not improving with medications, chest pain, difficulty breathing, non-stop vomiting, or coughing blood. Follow-up with your primary care provider if symptoms do not improve as expected in the next  5-7 days.

## 2020-10-20 NOTE — ED Provider Notes (Signed)
CHIEF COMPLAINT:   Chief Complaint  Patient presents with   Nasal Congestion   Nausea     SUBJECTIVE/HPI:  HPI A very pleasant 50 y.o.Male presents today with nasal congestion and nausea onset yesterday.  Patient states that he feels "clammy" along with generalized body aches and fatigue.  He does not report taking any home medications for his current symptoms.  He denies fever, abdominal pain, emesis, diarrhea.  Patient also denies any known sick contacts, but states that he does work in [employer] and is around a lot of people every day.  Patient also denies any known COVID-19 contacts or sick individuals within his home.  He denies any chest pain, shortness of breath.   has a past medical history of Hypertension, Hypogonadism in male, Low testosterone, and Prediabetes.  ROS:  Review of Systems See Subjective/HPI Medications, Allergies and Problem List personally reviewed in Epic today OBJECTIVE:   Vitals:   10/20/20 1228  BP: (!) 118/93  Pulse: (!) 101  Resp: 18  Temp: 98.6 F (37 C)  SpO2: 96%    Physical Exam   General: Appears well-developed and well-nourished. No acute distress.  HEENT Head: Normocephalic and atraumatic. Ears: Hearing grossly intact, no drainage or visible deformity.  Nose: No nasal deviation. Mouth/Throat: No stridor or tracheal deviation.  Non erythematous posterior pharynx noted with clear drainage present.  No white patchy exudate noted. Eyes: Conjunctivae and EOM are normal. No eye drainage or scleral icterus bilaterally.  Neck: Normal range of motion, neck is supple. No cervical, tonsillar or submandibular lymph nodes palpated.   Cardiovascular: Normal rate. Regular rhythm; no murmurs, gallops, or rubs.  Pulm/Chest: No respiratory distress. Breath sounds normal bilaterally without wheezes, rhonchi, or rales.  Neurological: Alert and oriented to person, place, and time.  Skin: Skin is warm and dry.  No rashes, lesions, abrasions or bruising  noted to skin.   Psychiatric: Normal mood, affect, behavior, and thought content.   Vital signs and nursing note reviewed.   Patient stable and cooperative with examination. PROCEDURES:    LABS/X-RAYS/EKG/MEDS:    No results found for any visits on 10/20/20.  MEDICAL DECISION MAKING:   Patient presents with nasal congestion and nausea onset yesterday.  Patient states that he feels "clammy" along with generalized body aches and fatigue.  He does not report taking any home medications for his current symptoms.  He denies fever, abdominal pain, emesis, diarrhea.  Patient also denies any known sick contacts, but states that he does work in [employer] and is around a lot of people every day.  Patient also denies any known COVID-19 contacts or sick individuals within his home.  He denies any chest pain, shortness of breath.  Chart review completed.  COVID-19 PCR obtained.  Given symptoms, likely viral illness.  Advised about home treatment and care along with strict return precautions for worsening of symptoms.  Work note provided.  Patient verbalized understanding and agreed with treatment plan.  Patient stable upon discharge. ASSESSMENT/PLAN:  1. Viral illness - Novel Coronavirus, NAA ([facility]); Standing - Novel Coronavirus, NAA ([facility])  No orders of the defined types were placed in this encounter.   Instructions about new medications and side effects provided.  Plan:   Discharge Instructions      We will call you with any positive results from your COVID-19 testing completed in clinic today.  If you do not receive a phone call from [place] within the next 3-5 days, check your MyChart for up-to-date health information  related to testing completed in clinic today.   For most people this is a self-limiting process and can take anywhere from 7 - 10 days to start feeling better. A cough can last up to 3 weeks. Pay special attention to handwashing as this can help prevent the spread of the  virus.   Always read the labels of cough and cold medications as they may contain some of the ingredients below.  Rest, push lots of fluids (especially water), and utilize supportive care for symptoms. You may take acetaminophen (Tylenol) every 4-6 hours and ibuprofen every 6-8 hours for muscle pain, joint pain, headaches (you may also alternate these medications). Mucinex (guaifenesin) may be taken over the counter for cough as needed can loosen phlegm. Please read the instructions and take as directed.  Sudafed (pseudophedrine) is sold behind the counter and can help reduce nasal pressure; avoid taking this if you have high blood pressure or feel jittery. Sudafed PE (phenylephrine) can be a helpful, short-term, over-the-counter alternative to limit side effects or if you have high blood pressure.  Flonase nasal spray can help alleviate congestion and sinus pressure. Many patients choose Afrin as a nasal decongestant; do not use for more than 3 days for risk of rebound (increased symptoms after stopping medication).  Saline nasal sprays or rinses can also help nasal congestion (use bottled or sterile water). Warm tea with lemon and honey can sooth sore throat and cough, as can cough drops.   Return to clinic for high fever not improving with medications, chest pain, difficulty breathing, non-stop vomiting, or coughing blood. Follow-up with your primary care provider if symptoms do not improve as expected in the next 5-7 days.        A copy of these instructions have been given to the patient or responsible adult who demonstrated the ability to learn, asked appropriate questions, and verbalized understanding of the plan of care.  There were no barriers to learning identified.    Serafina Royals, FNP-C 10/20/20  This note was partially made with the aid of speech-to-text dictation; typographical errors are not intentional.    Serafina Royals, [place] 10/20/20 1251

## 2020-10-21 LAB — NOVEL CORONAVIRUS, NAA: SARS-CoV-2, NAA: DETECTED — AB

## 2020-10-21 LAB — SARS-COV-2, NAA 2 DAY TAT

## 2020-10-24 ENCOUNTER — Encounter: Payer: Self-pay | Admitting: Gastroenterology

## 2020-11-07 ENCOUNTER — Other Ambulatory Visit: Payer: Self-pay | Admitting: Family Medicine

## 2020-11-10 NOTE — Telephone Encounter (Signed)
Ok to refill 

## 2020-11-21 ENCOUNTER — Ambulatory Visit (AMBULATORY_SURGERY_CENTER): Payer: 59 | Admitting: Gastroenterology

## 2020-11-21 ENCOUNTER — Encounter: Payer: Self-pay | Admitting: Gastroenterology

## 2020-11-21 ENCOUNTER — Other Ambulatory Visit: Payer: Self-pay

## 2020-11-21 VITALS — BP 117/79 | HR 65 | Temp 97.8°F | Resp 18 | Ht 65.0 in | Wt 185.0 lb

## 2020-11-21 DIAGNOSIS — Z1211 Encounter for screening for malignant neoplasm of colon: Secondary | ICD-10-CM | POA: Diagnosis not present

## 2020-11-21 DIAGNOSIS — D124 Benign neoplasm of descending colon: Secondary | ICD-10-CM | POA: Diagnosis not present

## 2020-11-21 MED ORDER — SODIUM CHLORIDE 0.9 % IV SOLN: 500.0000 mL | Freq: Once | INTRAVENOUS | Status: DC

## 2020-11-21 NOTE — Patient Instructions (Addendum)
Handouts were given to your care partner on polyps, diverticulosis and a high fiber diet with a liberal amount of fluid. You may resume your medications today. Await biopsy results.  May take 1-3 weeks to receive pathology results. Please call if any questions or concerns.      YOU HAD AN ENDOSCOPIC PROCEDURE TODAY AT [facility]:   Refer to the procedure report that was given to you for any specific questions about what was found during the examination.  If the procedure report does not answer your questions, please call your gastroenterologist to clarify.  If you requested that your care partner not be given the details of your procedure findings, then the procedure report has been included in a sealed envelope for you to review at your convenience later.  YOU SHOULD EXPECT: Some feelings of bloating in the abdomen. Passage of more gas than usual.  Walking can help get rid of the air that was put into your GI tract during the procedure and reduce the bloating. If you had a lower endoscopy (such as a colonoscopy or flexible sigmoidoscopy) you may notice spotting of blood in your stool or on the toilet paper. If you underwent a bowel prep for your procedure, you may not have a normal bowel movement for a few days.  Please Note:  You might notice some irritation and congestion in your nose or some drainage.  This is from the oxygen used during your procedure.  There is no need for concern and it should clear up in a day or so.  SYMPTOMS TO REPORT IMMEDIATELY:  Following lower endoscopy (colonoscopy or flexible sigmoidoscopy):  Excessive amounts of blood in the stool  Significant tenderness or worsening of abdominal pains  Swelling of the abdomen that is new, acute  Fever of 100F or higher   For urgent or emergent issues, a gastroenterologist can be reached at any hour by calling [phone]. Do not use MyChart messaging for urgent concerns.    DIET:  We do recommend a  small meal at first, but then you may proceed to your regular diet.  Drink plenty of fluids but you should avoid alcoholic beverages for 24 hours.  ACTIVITY:  You should plan to take it easy for the rest of today and you should NOT DRIVE or use heavy machinery until tomorrow (because of the sedation medicines used during the test).    FOLLOW UP: Our staff will call the number listed on your records 48-72 hours following your procedure to check on you and address any questions or concerns that you may have regarding the information given to you following your procedure. If we do not reach you, we will leave a message.  We will attempt to reach you two times.  During this call, we will ask if you have developed any symptoms of COVID 19. If you develop any symptoms (ie: fever, flu-like symptoms, shortness of breath, cough etc.) before then, please call [phone].  If you test positive for Covid 19 in the 2 weeks post procedure, please call and report this information to [place].    If any biopsies were taken you will be contacted by phone or by letter within the next 1-3 weeks.  Please call us at [phone] if you have not heard about the biopsies in 3 weeks.    SIGNATURES/CONFIDENTIALITY: You and/or your care partner have signed paperwork which will be entered into your electronic medical record.  These signatures attest to the fact  that that the information above on your After Visit Summary has been reviewed and is understood.  Full responsibility of the confidentiality of this discharge information lies with you and/or your care-partner.

## 2020-11-21 NOTE — Op Note (Signed)
[facility] Patient Name: Nathaniel Parrish Procedure Date: 11/21/2020 8:53 AM MRN: [id number] Endoscopist: Nicki Reaper E. Candis Schatz , MD Age: 50 Referring MD:  Date of Birth: May 06, 1970 Gender: Male Account #: [id number] Procedure:                Colonoscopy Indications:              Screening for colorectal malignant neoplasm, This                            is the patient's first colonoscopy Medicines:                Monitored Anesthesia Care Procedure:                Pre-Anesthesia Assessment:                           - Prior to the procedure, a History and Physical                            was performed, and patient medications and                            allergies were reviewed. The patient's tolerance of                            previous anesthesia was also reviewed. The risks                            and benefits of the procedure and the sedation                            options and risks were discussed with the patient.                            All questions were answered, and informed consent                            was obtained. Prior Anticoagulants: The patient has                            taken no previous anticoagulant or antiplatelet                            agents. ASA Grade Assessment: II - A patient with                            mild systemic disease. After reviewing the risks                            and benefits, the patient was deemed in                            satisfactory condition to undergo the procedure.  After obtaining informed consent, the colonoscope                            was passed under direct vision. Throughout the                            procedure, the patient's blood pressure, pulse, and                            oxygen saturations were monitored continuously. The                            CF [id number] was introduced through the anus                            and advanced to the  the terminal ileum, with                            identification of the appendiceal orifice and IC                            valve. The colonoscopy was performed without                            difficulty. The patient tolerated the procedure                            well. The quality of the bowel preparation was                            adequate. The terminal ileum, ileocecal valve,                            appendiceal orifice, and rectum were photographed. Scope In: 9:01:14 AM Scope Out: 9:14:00 AM Scope Withdrawal Time: 0 hours 11 minutes 32 seconds  Total Procedure Duration: 0 hours 12 minutes 46 seconds  Findings:                 The perianal and digital rectal examinations were                            normal. Pertinent negatives include normal                            sphincter tone and no palpable rectal lesions.                           A 4 mm polyp was found in the descending colon. The                            polyp was sessile. The polyp was removed with a                            cold snare. Resection  and retrieval were complete.                            Estimated blood loss was minimal.                           A few small-mouthed diverticula were found in the                            sigmoid colon and descending colon.                           The exam was otherwise normal throughout the                            examined colon.                           The terminal ileum appeared normal.                           The retroflexed view of the distal rectum and anal                            verge was normal and showed no anal or rectal                            abnormalities. Complications:            No immediate complications. Estimated Blood Loss:     Estimated blood loss was minimal. Impression:               - One 4 mm polyp in the descending colon, removed                            with a cold snare. Resected and retrieved.                            - Diverticulosis in the sigmoid colon and in the                            descending colon.                           - The examined portion of the ileum was normal.                           - The distal rectum and anal verge are normal on                            retroflexion view. Recommendation:           - Patient has a contact number available for                            emergencies. The signs and symptoms of potential  delayed complications were discussed with the                            patient. Return to normal activities tomorrow.                            Written discharge instructions were provided to the                            patient.                           - Resume previous diet.                           - Continue present medications.                           - Await pathology results.                           - Repeat colonoscopy in 7-10 years for surveillance. Talesha Ellithorpe E. Candis Schatz, MD 11/21/2020 9:22:41 AM This report has been signed electronically.

## 2020-11-21 NOTE — Progress Notes (Signed)
Pt's states no medical or surgical changes since previsit or office visit. 

## 2020-11-21 NOTE — Progress Notes (Signed)
C.W. vital signs. 

## 2020-11-21 NOTE — Progress Notes (Signed)
No problems noted in the recovery room. maw 

## 2020-11-21 NOTE — Progress Notes (Signed)
HPI : 50 y/o male undergoing initial avg risk screening colonoscopy.  No chronic GI symptoms.  No fam hx of CRC   Past Medical History:  Diagnosis Date   Hypertension    Hypogonadism in male    Low testosterone    Prediabetes      Past Surgical History:  Procedure Laterality Date   NASAL SEPTUM SURGERY     Family History  Problem Relation Age of Onset   Hypertension Mother    Heart disease Father        mi at 42, cabg at 74   Heart disease Paternal Aunt        mi at 74   Heart disease Paternal Uncle    Colon cancer Neg Hx    Colon polyps Neg Hx    Esophageal cancer Neg Hx    Rectal cancer Neg Hx    Stomach cancer Neg Hx    Social History   Tobacco Use   Smoking status: Never   Smokeless tobacco: Current    Types: Chew  Substance Use Topics   Alcohol use: No   Drug use: No   Current Outpatient Medications  Medication Sig Dispense Refill   hydrochlorothiazide (HYDRODIURIL) 25 MG tablet TAKE 1 TABLET BY MOUTH EVERY DAY 90 tablet 3   losartan (COZAAR) 100 MG tablet TAKE 1 TABLET BY MOUTH EVERY DAY NEEDS OFFICE VISIT 90 tablet 3   sildenafil (VIAGRA) 100 MG tablet TAKE 1/2 TO 1 TABLET(50 TO 100 MG) BY MOUTH DAILY AS NEEDED FOR ERECTILE DYSFUNCTION 5 tablet 2   SYRINGE-NEEDLE, DISP, 3 ML 25G X 1" 3 ML MISC Use as directed to inject testosterone IM Q 14 days 10 each 11   testosterone cypionate (DEPOTESTOSTERONE CYPIONATE) 200 MG/ML injection Inject 0.5 mLs (100 mg total) into the muscle every 14 (fourteen) days. 10 mL 3   Current Facility-Administered Medications  Medication Dose Route Frequency Provider Last Rate Last Admin   0.9 %  sodium chloride infusion  500 mL Intravenous Once Daryel November, MD       No Known Allergies   Review of Systems: All systems reviewed and negative except where noted in HPI.    No results found.  Physical Exam: BP 119/72   Pulse 68   Temp 97.8 F (36.6 C)   Ht '5\' 5"'$  (1.651 m)   Wt 185 lb (83.9 kg)   SpO2 98%   BMI  30.79 kg/m  Constitutional: Pleasant,well-developed, Caucasian male in no acute distress. HEENT: Normocephalic and atraumatic. Conjunctivae are normal. No scleral icterus. MP2 Cardiovascular: Normal rate, regular rhythm.  Pulmonary/chest: Effort normal and breath sounds normal. No wheezing, rales or rhonchi. Abdominal: Soft, nondistended, nontender. Bowel sounds active throughout. There are no masses palpable. No hepatomegaly. Neurological: Alert and oriented to person place and time. Skin: Skin is warm and dry. No rashes noted. Psychiatric: Normal mood and affect. Behavior is normal.  CBC    Component Value Date/Time   WBC 8.2 09/19/2020 0810   RBC 5.20 09/19/2020 0810   HGB 15.9 09/19/2020 0810   HCT 46.7 09/19/2020 0810   PLT 265 09/19/2020 0810   MCV 89.8 09/19/2020 0810   MCH 30.6 09/19/2020 0810   MCHC 34.0 09/19/2020 0810   RDW 12.6 09/19/2020 0810   LYMPHSABS 2,657 09/19/2020 0810   MONOABS 830 07/23/2015 0817   EOSABS 197 09/19/2020 0810   BASOSABS 57 09/19/2020 0810    CMP     Component Value Date/Time   NA  138 09/19/2020 0810   K 4.3 09/19/2020 0810   CL 101 09/19/2020 0810   CO2 25 09/19/2020 0810   GLUCOSE 108 (H) 09/19/2020 0810   BUN 22 09/19/2020 0810   CREATININE 0.98 09/19/2020 0810   CALCIUM 9.9 09/19/2020 0810   PROT 7.0 09/19/2020 0810   ALBUMIN 4.5 07/23/2015 0817   AST 17 09/19/2020 0810   ALT 29 09/19/2020 0810   ALKPHOS 59 07/23/2015 0817   BILITOT 0.6 09/19/2020 0810   GFRNONAA 89 09/19/2020 0810   GFRAA 103 09/19/2020 0810     ASSESSMENT AND PLAN: 50 y/o m here for initial avg risk screening colonoscopy.  Londyn Hotard E. Candis Schatz, MD [facility]

## 2020-11-21 NOTE — Progress Notes (Signed)
Called to room to assist during endoscopic procedure.  Patient ID and intended procedure confirmed with present staff. Received instructions for my participation in the procedure from the performing physician.  

## 2020-11-21 NOTE — Progress Notes (Signed)
pt tolerated well. VSS. awake and to recovery. Report given to RN.  

## 2020-11-25 ENCOUNTER — Telehealth: Payer: Self-pay

## 2020-11-25 NOTE — Telephone Encounter (Signed)
  Follow up Call-  Call back number 11/21/2020  Post procedure Call Back phone  # [phone]  Permission to leave phone message Yes  Some recent data might be hidden     Patient questions:  Do you have a fever, pain , or abdominal swelling? No. Pain Score  0 *  Have you tolerated food without any problems? Yes.    Have you been able to return to your normal activities? Yes.    Do you have any questions about your discharge instructions: Diet   No. Medications  No. Follow up visit  No.  Do you have questions or concerns about your Care? No.  Actions: * If pain score is 4 or above: No action needed, pain <4.

## 2020-12-10 NOTE — Progress Notes (Signed)
Mr. Bickers,  The polyp which I removed during your recent procedure was proven to be completely benign but is considered a "pre-cancerous" polyp that MAY have grown into cancer if it had not been removed.  Studies shows that at least 20% of women over age 52 and 30% of men over age 51 have pre-cancerous polyps.  Based on current nationally recognized surveillance guidelines, I recommend that you have a repeat colonoscopy in 7-10 years.   If you develop any new rectal bleeding, abdominal pain or significant bowel habit changes, please contact me before then.

## 2021-02-06 ENCOUNTER — Other Ambulatory Visit: Payer: Self-pay | Admitting: Family Medicine

## 2021-03-25 ENCOUNTER — Other Ambulatory Visit: Payer: Self-pay

## 2021-03-25 DIAGNOSIS — E7800 Pure hypercholesterolemia, unspecified: Secondary | ICD-10-CM

## 2021-03-25 DIAGNOSIS — E291 Testicular hypofunction: Secondary | ICD-10-CM

## 2021-03-25 DIAGNOSIS — R7303 Prediabetes: Secondary | ICD-10-CM

## 2021-03-25 DIAGNOSIS — I10 Essential (primary) hypertension: Secondary | ICD-10-CM

## 2021-03-25 DIAGNOSIS — Z0000 Encounter for general adult medical examination without abnormal findings: Secondary | ICD-10-CM

## 2021-03-27 ENCOUNTER — Other Ambulatory Visit: Payer: Self-pay

## 2021-03-27 ENCOUNTER — Other Ambulatory Visit: Payer: 59

## 2021-03-27 DIAGNOSIS — E291 Testicular hypofunction: Secondary | ICD-10-CM

## 2021-03-27 DIAGNOSIS — R7303 Prediabetes: Secondary | ICD-10-CM

## 2021-03-27 DIAGNOSIS — E7800 Pure hypercholesterolemia, unspecified: Secondary | ICD-10-CM

## 2021-03-27 DIAGNOSIS — I10 Essential (primary) hypertension: Secondary | ICD-10-CM

## 2021-03-28 LAB — LIPID PANEL
Cholesterol: 163 mg/dL (ref <200)
HDL: 47 mg/dL (ref >=40)
LDL Cholesterol (Calc): 91 mg/dL (calc)
Non-HDL Cholesterol (Calc): 116 mg/dL (calc) (ref <130)
Total CHOL/HDL Ratio: 3.5 (calc) (ref <5.0)
Triglycerides: 148 mg/dL (ref <150)

## 2021-03-28 LAB — COMPREHENSIVE METABOLIC PANEL
AG Ratio: 1.8 (calc) (ref 1.0–2.5)
ALT: 35 U/L (ref 9–46)
AST: 18 U/L (ref 10–35)
Albumin: 4.3 g/dL (ref 3.6–5.1)
Alkaline phosphatase (APISO): 59 U/L (ref 35–144)
BUN: 22 mg/dL (ref 7–25)
CO2: 31 mmol/L (ref 20–32)
Calcium: 9.5 mg/dL (ref 8.6–10.3)
Chloride: 102 mmol/L (ref 98–110)
Creat: 0.91 mg/dL (ref 0.70–1.30)
Globulin: 2.4 g/dL (calc) (ref 1.9–3.7)
Glucose, Bld: 117 mg/dL — ABNORMAL HIGH (ref 65–99)
Potassium: 3.9 mmol/L (ref 3.5–5.3)
Sodium: 141 mmol/L (ref 135–146)
Total Bilirubin: 0.7 mg/dL (ref 0.2–1.2)
Total Protein: 6.7 g/dL (ref 6.1–8.1)

## 2021-03-28 LAB — CBC WITH DIFFERENTIAL/PLATELET
Absolute Monocytes: 722 cells/uL (ref 200–950)
Basophils Absolute: 62 cells/uL (ref 0–200)
Basophils Relative: 0.7 %
Eosinophils Absolute: 238 cells/uL (ref 15–500)
Eosinophils Relative: 2.7 %
HCT: 48 % (ref 38.5–50.0)
Hemoglobin: 16.2 g/dL (ref 13.2–17.1)
Lymphs Abs: 3124 cells/uL (ref 850–3900)
MCH: 30.7 pg (ref 27.0–33.0)
MCHC: 33.8 g/dL (ref 32.0–36.0)
MCV: 90.9 fL (ref 80.0–100.0)
MPV: 9.9 fL (ref 7.5–12.5)
Monocytes Relative: 8.2 %
Neutro Abs: 4655 cells/uL (ref 1500–7800)
Neutrophils Relative %: 52.9 %
Platelets: 283 10*3/uL (ref 140–400)
RBC: 5.28 10*6/uL (ref 4.20–5.80)
RDW: 12.6 % (ref 11.0–15.0)
Total Lymphocyte: 35.5 %
WBC: 8.8 10*3/uL (ref 3.8–10.8)

## 2021-03-28 LAB — HEMOGLOBIN A1C
Hgb A1c MFr Bld: 6 % of total Hgb — ABNORMAL HIGH (ref <5.7)
Mean Plasma Glucose: 126 mg/dL
eAG (mmol/L): 7 mmol/L

## 2021-03-28 LAB — PSA: PSA: 1.94 ng/mL (ref <=4.00)

## 2021-04-27 ENCOUNTER — Other Ambulatory Visit: Payer: Self-pay | Admitting: Family Medicine

## 2021-04-28 NOTE — Telephone Encounter (Signed)
LOV 09/26/20 Last refill 09/26/20, 27mL, 3 refills Last testosterone level 09/26/20 268   Please review, thanks!

## 2021-05-10 ENCOUNTER — Other Ambulatory Visit: Payer: Self-pay | Admitting: Family Medicine

## 2021-05-11 NOTE — Telephone Encounter (Signed)
Viagra refill request, last filled 11/10/2020, last seen 09/26/2020.

## 2021-10-10 ENCOUNTER — Other Ambulatory Visit: Payer: Self-pay | Admitting: Family Medicine

## 2021-10-12 NOTE — Telephone Encounter (Signed)
Requested Prescriptions  Pending Prescriptions Disp Refills  . sildenafil (VIAGRA) 100 MG tablet [Pharmacy Med Name: SILDENAFIL '100MG'$  TABLETS] 5 tablet 0    Sig: TAKE 1/2 TO 1 TABLET(50 TO 100 MG) BY MOUTH DAILY AS NEEDED FOR ERECTILE DYSFUNCTION     Urology: Erectile Dysfunction Agents Failed - 10/10/2021  9:51 AM      Failed - Valid encounter within last 12 months    Recent Outpatient Visits          1 year ago Colon cancer screening   [place] Susy Frizzle, MD   1 year ago Diastasis recti   [place] Pickard, Cammie Mcgee, MD   2 years ago General medical exam   [place] Susy Frizzle, MD   4 years ago Essential hypertension   [place], Warren T, MD   5 years ago [place], Cammie Mcgee, MD             Passed - AST in normal range and within 360 days    AST  Date Value Ref Range Status  03/27/2021 18 10 - 35 U/L Final         Passed - ALT in normal range and within 360 days    ALT  Date Value Ref Range Status  03/27/2021 35 9 - 46 U/L Final         Passed - Last BP in normal range    BP Readings from Last 1 Encounters:  11/21/20 117/79

## 2021-12-19 ENCOUNTER — Other Ambulatory Visit: Payer: Self-pay | Admitting: Family Medicine

## 2022-02-03 ENCOUNTER — Other Ambulatory Visit: Payer: Self-pay | Admitting: Family Medicine

## 2022-02-03 DIAGNOSIS — M5030 Other cervical disc degeneration, unspecified cervical region: Secondary | ICD-10-CM | POA: Insufficient documentation

## 2022-02-04 NOTE — Telephone Encounter (Signed)
Will refill medication until patient can make an OV. Patient needs OV for additional refills.  Requested Prescriptions  Pending Prescriptions Disp Refills  . losartan (COZAAR) 100 MG tablet [Pharmacy Med Name: LOSARTAN '100MG'$  TABLETS] 30 tablet 0    Sig: TAKE 1 TABLET BY MOUTH EVERY DAY     Cardiovascular:  Angiotensin Receptor Blockers Failed - 02/03/2022  8:08 AM      Failed - Cr in normal range and within 180 days    Creat  Date Value Ref Range Status  03/27/2021 0.91 0.70 - 1.30 mg/dL Final   Creatinine, Urine  Date Value Ref Range Status  09/19/2020 71 20 - 320 mg/dL Final         Failed - K in normal range and within 180 days    Potassium  Date Value Ref Range Status  03/27/2021 3.9 3.5 - 5.3 mmol/L Final         Failed - Valid encounter within last 6 months    Recent Outpatient Visits          1 year ago Colon cancer screening   [place] Susy Frizzle, MD   2 years ago Diastasis recti   [place] Susy Frizzle, MD   2 years ago General medical exam   [place] Susy Frizzle, MD   4 years ago Essential hypertension   [place], Cammie Mcgee, MD   5 years ago [place], Cammie Mcgee, MD             Passed - Patient is not pregnant      Passed - Last BP in normal range    BP Readings from Last 1 Encounters:  11/21/20 117/79         . hydrochlorothiazide (HYDRODIURIL) 25 MG tablet [Pharmacy Med Name: HYDROCHLOROTHIAZIDE '25MG'$  TABLETS] 30 tablet 0    Sig: TAKE 1 TABLET BY MOUTH EVERY DAY     Cardiovascular: Diuretics - Thiazide Failed - 02/03/2022  8:08 AM      Failed - Cr in normal range and within 180 days    Creat  Date Value Ref Range Status  03/27/2021 0.91 0.70 - 1.30 mg/dL Final   Creatinine, Urine  Date Value Ref Range Status  09/19/2020 71 20 - 320 mg/dL Final         Failed - K in normal range and within 180 days     Potassium  Date Value Ref Range Status  03/27/2021 3.9 3.5 - 5.3 mmol/L Final         Failed - Na in normal range and within 180 days    Sodium  Date Value Ref Range Status  03/27/2021 141 135 - 146 mmol/L Final         Failed - Valid encounter within last 6 months    Recent Outpatient Visits          1 year ago Colon cancer screening   [place] Susy Frizzle, MD   2 years ago Diastasis recti   [place] Susy Frizzle, MD   2 years ago General medical exam   [place] Susy Frizzle, MD   4 years ago Essential hypertension   [place], Cammie Mcgee, MD   5 years ago [place], Cammie Mcgee, MD  Passed - Last BP in normal range    BP Readings from Last 1 Encounters:  11/21/20 117/79

## 2022-02-12 ENCOUNTER — Other Ambulatory Visit: Payer: Self-pay | Admitting: Family Medicine

## 2022-02-15 NOTE — Telephone Encounter (Signed)
Requested medications are due for refill today.  yes  Requested medications are on the active medications list.  yes  Last refill. 12/21/2021 #5 0 rf  Future visit scheduled.   no  Notes to clinic.  Pt last seen 1 year ago    Requested Prescriptions  Pending Prescriptions Disp Refills   sildenafil (VIAGRA) 100 MG tablet [Pharmacy Med Name: SILDENAFIL '100MG'$  TABLETS] 5 tablet 0    Sig: TAKE 1/2 TO 1 TABLET(50 TO 100 MG) BY MOUTH DAILY AS NEEDED FOR ERECTILE DYSFUNCTION     Urology: Erectile Dysfunction Agents Failed - 02/12/2022  8:47 PM      Failed - Valid encounter within last 12 months    Recent Outpatient Visits           1 year ago Colon cancer screening   [place] Susy Frizzle, MD   2 years ago Diastasis recti   [place] Pickard, Cammie Mcgee, MD   2 years ago General medical exam   [place] Susy Frizzle, MD   4 years ago Essential hypertension   [place] Susy Frizzle, MD   5 years ago [place], Cammie Mcgee, MD              Passed - AST in normal range and within 360 days    AST  Date Value Ref Range Status  03/27/2021 18 10 - 35 U/L Final         Passed - ALT in normal range and within 360 days    ALT  Date Value Ref Range Status  03/27/2021 35 9 - 46 U/L Final         Passed - Last BP in normal range    BP Readings from Last 1 Encounters:  11/21/20 117/79

## 2022-02-27 ENCOUNTER — Other Ambulatory Visit: Payer: Self-pay | Admitting: Family Medicine

## 2022-03-01 MED ORDER — SILDENAFIL CITRATE 100 MG PO TABS: 100.0000 mg | ORAL_TABLET | ORAL | 0 refills | Status: DC | PRN

## 2022-04-22 ENCOUNTER — Other Ambulatory Visit: Payer: Self-pay | Admitting: Family Medicine

## 2022-04-22 DIAGNOSIS — Z1211 Encounter for screening for malignant neoplasm of colon: Secondary | ICD-10-CM

## 2022-05-09 ENCOUNTER — Other Ambulatory Visit: Payer: Self-pay | Admitting: Family Medicine

## 2022-05-10 ENCOUNTER — Other Ambulatory Visit: Payer: Self-pay | Admitting: Family Medicine

## 2022-05-10 MED ORDER — HYDROCHLOROTHIAZIDE 25 MG PO TABS: 25.0000 mg | ORAL_TABLET | Freq: Every day | ORAL | 0 refills | Status: DC

## 2022-05-10 MED ORDER — LOSARTAN POTASSIUM 100 MG PO TABS: 100.0000 mg | ORAL_TABLET | Freq: Every day | ORAL | 0 refills | Status: DC

## 2022-06-05 ENCOUNTER — Other Ambulatory Visit: Payer: Self-pay | Admitting: Family Medicine

## 2022-06-08 ENCOUNTER — Other Ambulatory Visit: Payer: Self-pay | Admitting: Family Medicine

## 2022-06-09 ENCOUNTER — Other Ambulatory Visit: Payer: Self-pay

## 2022-06-09 ENCOUNTER — Other Ambulatory Visit: Payer: Self-pay | Admitting: Family Medicine

## 2022-06-09 DIAGNOSIS — I10 Essential (primary) hypertension: Secondary | ICD-10-CM

## 2022-06-09 MED ORDER — HYDROCHLOROTHIAZIDE 25 MG PO TABS: 25.0000 mg | ORAL_TABLET | Freq: Every day | ORAL | 0 refills | Status: DC

## 2022-06-09 MED ORDER — HYDROCHLOROTHIAZIDE 25 MG PO TABS: 25.0000 mg | ORAL_TABLET | Freq: Every day | ORAL | 1 refills | Status: DC

## 2022-06-09 MED ORDER — LOSARTAN POTASSIUM 100 MG PO TABS: 100.0000 mg | ORAL_TABLET | Freq: Every day | ORAL | 0 refills | Status: DC

## 2022-06-09 NOTE — Telephone Encounter (Signed)
Patient called to request a 90 day supply of   losartan (COZAAR) 100 MG tablet [id number]   hydrochlorothiazide (HYDRODIURIL) 25 MG tablet [id number]   Please advise at [phone].

## 2022-06-10 MED ORDER — SILDENAFIL CITRATE 100 MG PO TABS: 100.0000 mg | ORAL_TABLET | ORAL | 0 refills | Status: DC | PRN

## 2022-07-04 ENCOUNTER — Other Ambulatory Visit: Payer: Self-pay | Admitting: Family Medicine

## 2022-07-05 ENCOUNTER — Other Ambulatory Visit: Payer: Self-pay | Admitting: Family Medicine

## 2022-07-05 NOTE — Telephone Encounter (Signed)
Appointment scheduled 07/08/22 Requested Prescriptions  Pending Prescriptions Disp Refills   losartan (COZAAR) 100 MG tablet [Pharmacy Med Name: LOSARTAN 100MG  TABLETS] 30 tablet 0    Sig: TAKE 1 TABLET(100 MG) BY MOUTH DAILY     Cardiovascular:  Angiotensin Receptor Blockers Failed - 07/04/2022  9:32 AM      Failed - Cr in normal range and within 180 days    Creat  Date Value Ref Range Status  03/27/2021 0.91 0.70 - 1.30 mg/dL Final   Creatinine, Urine  Date Value Ref Range Status  09/19/2020 71 20 - 320 mg/dL Final         Failed - K in normal range and within 180 days    Potassium  Date Value Ref Range Status  03/27/2021 3.9 3.5 - 5.3 mmol/L Final         Failed - Valid encounter within last 6 months    Recent Outpatient Visits           1 year ago Colon cancer screening   [place] Susy Frizzle, MD   2 years ago Diastasis recti   [place], Cammie Mcgee, MD   3 years ago General medical exam   [place] Susy Frizzle, MD   5 years ago Essential hypertension   [place] Schaumann, Cammie Mcgee, MD   6 years ago [place], Cammie Mcgee, MD       Future Appointments             In 1 week Pickard, Cammie Mcgee, MD [place], [place] - Patient is not pregnant      Passed - Last BP in normal range    BP Readings from Last 1 Encounters:  11/21/20 117/79

## 2022-07-06 NOTE — Telephone Encounter (Signed)
Rx 07/05/22- #30 Requested Prescriptions  Pending Prescriptions Disp Refills   losartan (COZAAR) 100 MG tablet [Pharmacy Med Name: LOSARTAN 100MG  TABLETS] 90 tablet     Sig: TAKE 1 TABLET(100 MG) BY MOUTH DAILY     Cardiovascular:  Angiotensin Receptor Blockers Failed - 07/05/2022  5:55 PM      Failed - Cr in normal range and within 180 days    Creat  Date Value Ref Range Status  03/27/2021 0.91 0.70 - 1.30 mg/dL Final   Creatinine, Urine  Date Value Ref Range Status  09/19/2020 71 20 - 320 mg/dL Final         Failed - K in normal range and within 180 days    Potassium  Date Value Ref Range Status  03/27/2021 3.9 3.5 - 5.3 mmol/L Final         Failed - Valid encounter within last 6 months    Recent Outpatient Visits           1 year ago Colon cancer screening   [place] Susy Frizzle, MD   2 years ago Diastasis recti   [place] Dennard Schaumann, Cammie Mcgee, MD   3 years ago General medical exam   [place] Susy Frizzle, MD   5 years ago Essential hypertension   [place], Cammie Mcgee, MD   6 years ago [place], Cammie Mcgee, MD       Future Appointments             In 1 week Pickard, Cammie Mcgee, MD [place], [place] - Patient is not pregnant      Passed - Last BP in normal range    BP Readings from Last 1 Encounters:  11/21/20 117/79

## 2022-07-08 ENCOUNTER — Other Ambulatory Visit: Payer: 59

## 2022-07-08 DIAGNOSIS — E291 Testicular hypofunction: Secondary | ICD-10-CM

## 2022-07-08 DIAGNOSIS — R7303 Prediabetes: Secondary | ICD-10-CM

## 2022-07-08 DIAGNOSIS — Z13220 Encounter for screening for lipoid disorders: Secondary | ICD-10-CM

## 2022-07-09 ENCOUNTER — Other Ambulatory Visit: Payer: 59

## 2022-07-09 LAB — CBC WITH DIFFERENTIAL/PLATELET
Absolute Monocytes: 783 cells/uL (ref 200–950)
Basophils Absolute: 36 cells/uL (ref 0–200)
Basophils Relative: 0.4 %
Eosinophils Absolute: 223 cells/uL (ref 15–500)
Eosinophils Relative: 2.5 %
HCT: 44 % (ref 38.5–50.0)
Hemoglobin: 15.1 g/dL (ref 13.2–17.1)
Lymphs Abs: 2697 cells/uL (ref 850–3900)
MCH: 30.6 pg (ref 27.0–33.0)
MCHC: 34.3 g/dL (ref 32.0–36.0)
MCV: 89.2 fL (ref 80.0–100.0)
MPV: 9.6 fL (ref 7.5–12.5)
Monocytes Relative: 8.8 %
Neutro Abs: 5162 cells/uL (ref 1500–7800)
Neutrophils Relative %: 58 %
Platelets: 284 10*3/uL (ref 140–400)
RBC: 4.93 10*6/uL (ref 4.20–5.80)
RDW: 12.2 % (ref 11.0–15.0)
Total Lymphocyte: 30.3 %
WBC: 8.9 10*3/uL (ref 3.8–10.8)

## 2022-07-09 LAB — HEMOGLOBIN A1C
Hgb A1c MFr Bld: 6.1 % of total Hgb — ABNORMAL HIGH (ref <5.7)
Mean Plasma Glucose: 128 mg/dL
eAG (mmol/L): 7.1 mmol/L

## 2022-07-09 LAB — COMPLETE METABOLIC PANEL WITH GFR
AG Ratio: 1.6 (calc) (ref 1.0–2.5)
ALT: 26 U/L (ref 9–46)
AST: 15 U/L (ref 10–35)
Albumin: 4.4 g/dL (ref 3.6–5.1)
Alkaline phosphatase (APISO): 69 U/L (ref 35–144)
BUN/Creatinine Ratio: 26 (calc) — ABNORMAL HIGH (ref 6–22)
BUN: 27 mg/dL — ABNORMAL HIGH (ref 7–25)
CO2: 29 mmol/L (ref 20–32)
Calcium: 9.9 mg/dL (ref 8.6–10.3)
Chloride: 103 mmol/L (ref 98–110)
Creat: 1.03 mg/dL (ref 0.70–1.30)
Globulin: 2.7 g/dL (calc) (ref 1.9–3.7)
Glucose, Bld: 109 mg/dL — ABNORMAL HIGH (ref 65–99)
Potassium: 4.3 mmol/L (ref 3.5–5.3)
Sodium: 142 mmol/L (ref 135–146)
Total Bilirubin: 0.4 mg/dL (ref 0.2–1.2)
Total Protein: 7.1 g/dL (ref 6.1–8.1)
eGFR: 87 mL/min/{1.73_m2} (ref >=60)

## 2022-07-09 LAB — LIPID PANEL
Cholesterol: 167 mg/dL (ref <200)
HDL: 48 mg/dL (ref >=40)
LDL Cholesterol (Calc): 92 mg/dL (calc)
Non-HDL Cholesterol (Calc): 119 mg/dL (calc) (ref <130)
Total CHOL/HDL Ratio: 3.5 (calc) (ref <5.0)
Triglycerides: 168 mg/dL — ABNORMAL HIGH (ref <150)

## 2022-07-11 ENCOUNTER — Other Ambulatory Visit: Payer: Self-pay | Admitting: Family Medicine

## 2022-07-11 DIAGNOSIS — I10 Essential (primary) hypertension: Secondary | ICD-10-CM

## 2022-07-12 ENCOUNTER — Other Ambulatory Visit: Payer: Self-pay | Admitting: Family Medicine

## 2022-07-12 DIAGNOSIS — I10 Essential (primary) hypertension: Secondary | ICD-10-CM

## 2022-07-12 NOTE — Telephone Encounter (Signed)
Requested Prescriptions  Pending Prescriptions Disp Refills   hydrochlorothiazide (HYDRODIURIL) 25 MG tablet [Pharmacy Med Name: HYDROCHLOROTHIAZIDE 25MG  TABLETS] 30 tablet 0    Sig: TAKE 1 TABLET(25 MG) BY MOUTH DAILY     Cardiovascular: Diuretics - Thiazide Failed - 07/11/2022  8:08 AM      Failed - Valid encounter within last 6 months    Recent Outpatient Visits           1 year ago Colon cancer screening   [place] Susy Frizzle, MD   2 years ago Diastasis recti   [place] Pickard, Cammie Mcgee, MD   3 years ago General medical exam   [place] Susy Frizzle, MD   5 years ago Essential hypertension   [place], Cammie Mcgee, MD   6 years ago [place], Cammie Mcgee, MD       Future Appointments             In 3 days Pickard, Cammie Mcgee, MD [facility]            Passed - Cr in normal range and within 180 days    Creat  Date Value Ref Range Status  07/08/2022 1.03 0.70 - 1.30 mg/dL Final   Creatinine, Urine  Date Value Ref Range Status  09/19/2020 71 20 - 320 mg/dL Final         Passed - K in normal range and within 180 days    Potassium  Date Value Ref Range Status  07/08/2022 4.3 3.5 - 5.3 mmol/L Final         Passed - Na in normal range and within 180 days    Sodium  Date Value Ref Range Status  07/08/2022 142 135 - 146 mmol/L Final         Passed - Last BP in normal range    BP Readings from Last 1 Encounters:  11/21/20 117/79

## 2022-07-15 ENCOUNTER — Ambulatory Visit: Payer: BC Managed Care – PPO | Admitting: Family Medicine

## 2022-07-15 ENCOUNTER — Encounter: Payer: Self-pay | Admitting: Family Medicine

## 2022-07-15 VITALS — BP 122/80 | HR 100 | Temp 98.5°F | Ht 65.0 in | Wt 191.0 lb

## 2022-07-15 DIAGNOSIS — E291 Testicular hypofunction: Secondary | ICD-10-CM

## 2022-07-15 DIAGNOSIS — I10 Essential (primary) hypertension: Secondary | ICD-10-CM

## 2022-07-15 DIAGNOSIS — Z0001 Encounter for general adult medical examination with abnormal findings: Secondary | ICD-10-CM | POA: Diagnosis not present

## 2022-07-15 DIAGNOSIS — R7303 Prediabetes: Secondary | ICD-10-CM | POA: Diagnosis not present

## 2022-07-15 DIAGNOSIS — Z0000 Encounter for general adult medical examination without abnormal findings: Secondary | ICD-10-CM

## 2022-07-15 MED ORDER — TERBINAFINE HCL 250 MG PO TABS: 250.0000 mg | ORAL_TABLET | Freq: Every day | ORAL | 2 refills | Status: DC

## 2022-07-15 NOTE — Progress Notes (Signed)
Subjective:    Patient ID: Nathaniel Parrish, male    DOB: 11/14/1970, 52 y.o.   MRN: [id number]  Patient is here today for complete physical exam.  Patient states that he has not been on his testosterone shots for several months.  He states that when he takes them he sees benefit.  The biggest benefit is with fatigue.  He gives him more energy and more stamina.  His last dose was more than 3 months ago.  He is overdue to check a PSA.  He is taking Cozaar and hydrochlorothiazide.  His blood pressure today is well-controlled.  He has a history of prediabetes, his hemoglobin A1c is stable at 6.1.  He admits to eating a high carbohydrate diet.  He reports thick yellow toenails on both feet.  He has onychomycosis on all the toenails of both feet.  They have been like that for years.  He is requesting treatment for that.  He is due for a shingles vaccine.  He is due for COVID booster.  His tetanus shot is not due until 2029.  His colonoscopy is due in 2029 Lab on 07/08/2022  Component Date Value Ref Range Status   WBC 07/08/2022 8.9  3.8 - 10.8 Thousand/uL Final   RBC 07/08/2022 4.93  4.20 - 5.80 Million/uL Final   Hemoglobin 07/08/2022 15.1  13.2 - 17.1 g/dL Final   HCT 07/08/2022 44.0  38.5 - 50.0 % Final   MCV 07/08/2022 89.2  80.0 - 100.0 fL Final   MCH 07/08/2022 30.6  27.0 - 33.0 pg Final   MCHC 07/08/2022 34.3  32.0 - 36.0 g/dL Final   RDW 07/08/2022 12.2  11.0 - 15.0 % Final   Platelets 07/08/2022 284  140 - 400 Thousand/uL Final   MPV 07/08/2022 9.6  7.5 - 12.5 fL Final   Neutro Abs 07/08/2022 5,162  1,500 - 7,800 cells/uL Final   Lymphs Abs 07/08/2022 2,697  850 - 3,900 cells/uL Final   Absolute Monocytes 07/08/2022 783  200 - 950 cells/uL Final   Eosinophils Absolute 07/08/2022 223  15 - 500 cells/uL Final   Basophils Absolute 07/08/2022 36  0 - 200 cells/uL Final   Neutrophils Relative % 07/08/2022 58  % Final   Total Lymphocyte 07/08/2022 30.3  % Final   Monocytes Relative  07/08/2022 8.8  % Final   Eosinophils Relative 07/08/2022 2.5  % Final   Basophils Relative 07/08/2022 0.4  % Final   Glucose, Bld 07/08/2022 109 (H)  65 - 99 mg/dL Final   Comment: .            Fasting reference interval . For someone without known diabetes, a glucose value between 100 and 125 mg/dL is consistent with prediabetes and should be confirmed with a follow-up test. .    BUN 07/08/2022 27 (H)  7 - 25 mg/dL Final   Creat 07/08/2022 1.03  0.70 - 1.30 mg/dL Final   eGFR 07/08/2022 87  > OR = 60 mL/min/1.15m2 Final   BUN/Creatinine Ratio 07/08/2022 26 (H)  6 - 22 (calc) Final   Sodium 07/08/2022 142  135 - 146 mmol/L Final   Potassium 07/08/2022 4.3  3.5 - 5.3 mmol/L Final   Chloride 07/08/2022 103  98 - 110 mmol/L Final   CO2 07/08/2022 29  20 - 32 mmol/L Final   Calcium 07/08/2022 9.9  8.6 - 10.3 mg/dL Final   Total Protein 07/08/2022 7.1  6.1 - 8.1 g/dL Final   Albumin 07/08/2022 4.4  3.6 - 5.1 g/dL Final   Globulin 07/08/2022 2.7  1.9 - 3.7 g/dL (calc) Final   AG Ratio 07/08/2022 1.6  1.0 - 2.5 (calc) Final   Total Bilirubin 07/08/2022 0.4  0.2 - 1.2 mg/dL Final   Alkaline phosphatase (APISO) 07/08/2022 69  35 - 144 U/L Final   AST 07/08/2022 15  10 - 35 U/L Final   ALT 07/08/2022 26  9 - 46 U/L Final   Hgb A1c MFr Bld 07/08/2022 6.1 (H)  <5.7 % of total Hgb Final   Comment: For someone without known diabetes, a hemoglobin  A1c value between 5.7% and 6.4% is consistent with prediabetes and should be confirmed with a  follow-up test. . For someone with known diabetes, a value <7% indicates that their diabetes is well controlled. A1c targets should be individualized based on duration of diabetes, age, comorbid conditions, and other considerations. . This assay result is consistent with an increased risk of diabetes. . Currently, no consensus exists regarding use of hemoglobin A1c for diagnosis of diabetes for children. .    Mean Plasma Glucose 07/08/2022 128   mg/dL Final   eAG (mmol/L) 07/08/2022 7.1  mmol/L Final   Comment: . This test was performed on the Roche cobas c503 platform. Effective 01/18/22, a change in test platforms from the Abbott Architect to the Roche cobas c503 may have shifted HbA1c results compared to historical results. Based on laboratory validation testing conducted at [facility] relative to the Abbott platform had an average increase in HbA1c value of < or = 0.3%. This difference is within accepted  variability established by the [facility]. Note that not all individuals will have had a shift in their results and direct comparisons between historical and current results for testing conducted on different platforms is not recommended.    Cholesterol 07/08/2022 167  <200 mg/dL Final   HDL 07/08/2022 48  > OR = 40 mg/dL Final   Triglycerides 07/08/2022 168 (H)  <150 mg/dL Final   LDL Cholesterol (Calc) 07/08/2022 92  mg/dL (calc) Final   Comment: Reference range: <100 . Desirable range <100 mg/dL for primary prevention;   <70 mg/dL for patients with CHD or diabetic patients  with > or = 2 CHD risk factors. Marland Kitchen LDL-C is now calculated using the Martin-Hopkins  calculation, which is a validated novel method providing  better accuracy than the Friedewald equation in the  estimation of LDL-C.  Cresenciano Genre et al. Annamaria Helling. [id number]): 2061-2068  (http://education.[facility].com/faq/FAQ164)    Total CHOL/HDL Ratio 07/08/2022 3.5  <5.0 (calc) Final   Non-HDL Cholesterol (Calc) 07/08/2022 119  <130 mg/dL (calc) Final   Comment: For patients with diabetes plus 1 major ASCVD risk  factor, treating to a non-HDL-C goal of <100 mg/dL  (LDL-C of <70 mg/dL) is considered a therapeutic  option.     Past Medical History:  Diagnosis Date   Hypertension    Hypogonadism in male    Low testosterone    Prediabetes    Past Surgical History:  Procedure Laterality Date    NASAL SEPTUM SURGERY     Current Outpatient Medications on File Prior to Visit  Medication Sig Dispense Refill   BD ECLIPSE SYRINGE 25G X 1" 3 ML MISC USE AS DIRECTED TO INJECT TESTOSTERONE IN THE MUSCLE EVERY 14 DAYS 10 each 11   hydrochlorothiazide (HYDRODIURIL) 25 MG tablet TAKE 1 TABLET(25 MG) BY MOUTH DAILY 90 tablet 0   losartan (COZAAR) 100 MG tablet  TAKE 1 TABLET(100 MG) BY MOUTH DAILY 30 tablet 0   sildenafil (VIAGRA) 100 MG tablet Take 1 tablet (100 mg total) by mouth as needed for erectile dysfunction. 5 tablet 0   testosterone cypionate (DEPOTESTOSTERONE CYPIONATE) 200 MG/ML injection INJECT 0.5 ML( 100 MG TOTAL) IN THE MUSCLE EVERY 14 DAYS 10 mL 0   No current facility-administered medications on file prior to visit.   No Known Allergies Social History   Socioeconomic History   Marital status: Married    Spouse name: Not on file   Number of children: Not on file   Years of education: Not on file   Highest education level: Not on file  Occupational History   Not on file  Tobacco Use   Smoking status: Never   Smokeless tobacco: Current    Types: Chew  Substance and Sexual Activity   Alcohol use: No   Drug use: No   Sexual activity: Yes    Comment: married  Other Topics Concern   Not on file  Social History Narrative   Not on file   Social Determinants of Health   Financial Resource Strain: Not on file  Food Insecurity: Not on file  Transportation Needs: Not on file  Physical Activity: Not on file  Stress: Not on file  Social Connections: Not on file  Intimate Partner Violence: Not on file     Review of Systems  All other systems reviewed and are negative.      Objective:   Physical Exam Vitals reviewed.  Constitutional:      General: He is not in acute distress.    Appearance: He is well-developed. He is not diaphoretic.  HENT:     Head: Normocephalic and atraumatic.     Right Ear: External ear normal.     Left Ear: External ear normal.      Nose: Nose normal.     Mouth/Throat:     Pharynx: No oropharyngeal exudate.  Eyes:     General: No scleral icterus.       Right eye: No discharge.        Left eye: No discharge.     Conjunctiva/sclera: Conjunctivae normal.     Pupils: Pupils are equal, round, and reactive to light.  Neck:     Thyroid: No thyromegaly.     Vascular: No JVD.  Cardiovascular:     Rate and Rhythm: Normal rate and regular rhythm.     Heart sounds: Normal heart sounds. No murmur heard. Pulmonary:     Effort: Pulmonary effort is normal. No respiratory distress.     Breath sounds: Normal breath sounds. No wheezing or rales.  Chest:     Chest wall: No tenderness.  Abdominal:     General: Bowel sounds are normal. There is no distension.     Palpations: Abdomen is soft. There is no mass.     Tenderness: There is no abdominal tenderness. There is no guarding or rebound.  Musculoskeletal:     Cervical back: Neck supple.  Lymphadenopathy:     Cervical: No cervical adenopathy.  Skin:    General: Skin is warm.     Findings: Rash present.  Neurological:     Mental Status: He is alert and oriented to person, place, and time.     Cranial Nerves: No cranial nerve deficit.     Motor: No abnormal muscle tone.     Coordination: Coordination normal.     Deep Tendon Reflexes: Reflexes normal.  Psychiatric:  Behavior: Behavior normal.        Thought Content: Thought content normal.        Judgment: Judgment normal.   Patient has numerous actinic keratoses on his scalp and forehead.  He sees dermatology every 6 months.        Assessment & Plan:  Hypogonadism in male - Plan: PSA, Testosterone Total,Free,Bio, Males  General medical exam  Prediabetes  Essential hypertension Strongly encouraged the patient to follow-up with a dermatologist due to the precancers on his forehead and crown of his head.  His blood pressure is excellent.  Cholesterol is excellent.  Recommended a low carbohydrate diet to  address his Beatties.  Check a PSA today.  If PSA is stable, we will resume testosterone but he would like to try testosterone cream.  I did strongly encourage him to check a PSA every 6 months while taking testosterone cream to monitor for any complications from the testosterone therapy.  He can get the shingles vaccine whenever he would like to get it. I gave the patient drips to penicillin 150 g per cubic 30 days.  Gave him 2 refills to complete 90 days of therapy.  I recommended checking liver function test every 30 days while taking the medication.

## 2022-07-16 LAB — TESTOSTERONE TOTAL,FREE,BIO, MALES
Albumin: 4.9 g/dL (ref 3.6–5.1)
Sex Hormone Binding: 26 nmol/L (ref 10–50)
Testosterone: 151 ng/dL — ABNORMAL LOW (ref 250–827)

## 2022-07-16 LAB — PSA: PSA: 2.57 ng/mL (ref <=4.00)

## 2022-07-19 ENCOUNTER — Other Ambulatory Visit: Payer: Self-pay | Admitting: Family Medicine

## 2022-07-19 MED ORDER — TESTOSTERONE 20.25 MG/ACT (1.62%) TD GEL: 2.0000 | Freq: Every day | TRANSDERMAL | 2 refills | Status: DC

## 2022-07-20 ENCOUNTER — Telehealth: Payer: Self-pay

## 2022-07-20 NOTE — Telephone Encounter (Signed)
Fax received from [employer]. Testosterone 1.62% gel is not covered by plan. PA completed and sent through cover my meds. Mjp,lpn

## 2022-07-20 NOTE — Telephone Encounter (Signed)
Nathaniel Parrish (Key: [id number])Need help? Call us at [phone] Status Sent to [employer] The plan will fax you a determination, typically within 1 to 5 business days.  How do I follow up? Drug Testosterone 1.62% gel Form [employer] Form Prior Authorization for [employer] [phone] [phone]

## 2022-08-04 ENCOUNTER — Telehealth: Payer: Self-pay

## 2022-08-04 NOTE — Telephone Encounter (Signed)
Pt's insurance has denied the Testosterone 1.62% gel. Pt's insurance requires two low testosterone levels drawn in the early morning. Can we have him come in to do early AM levels and try to appeal? Thanks.

## 2022-08-15 ENCOUNTER — Other Ambulatory Visit: Payer: Self-pay | Admitting: Family Medicine

## 2022-08-17 NOTE — Telephone Encounter (Signed)
Requested Prescriptions  Pending Prescriptions Disp Refills   losartan (COZAAR) 100 MG tablet [Pharmacy Med Name: LOSARTAN 100MG  TABLETS] 90 tablet 1    Sig: TAKE 1 TABLET(100 MG) BY MOUTH DAILY     Cardiovascular:  Angiotensin Receptor Blockers Failed - 08/15/2022  3:29 PM      Failed - Valid encounter within last 6 months    Recent Outpatient Visits           1 year ago Colon cancer screening   [facility] Donita Brooks, MD   2 years ago Diastasis recti   [facility] Pickard, Priscille Heidelberg, MD   3 years ago General medical exam   [facility] Donita Brooks, MD   5 years ago Essential hypertension   [facility] Donita Brooks, MD   6 years ago Podagra   [facility] Pickard, Priscille Heidelberg, MD              Passed - Cr in normal range and within 180 days    Creat  Date Value Ref Range Status  07/08/2022 1.03 0.70 - 1.30 mg/dL Final   Creatinine, Urine  Date Value Ref Range Status  09/19/2020 71 20 - 320 mg/dL Final         Passed - K in normal range and within 180 days    Potassium  Date Value Ref Range Status  07/08/2022 4.3 3.5 - 5.3 mmol/L Final         Passed - Patient is not pregnant      Passed - Last BP in normal range    BP Readings from Last 1 Encounters:  07/15/22 122/80          sildenafil (VIAGRA) 100 MG tablet [Pharmacy Med Name: SILDENAFIL 100MG  TABLETS] 5 tablet 1    Sig: TAKE 1/2 TO 1 TABLET(50 TO 100 MG) BY MOUTH DAILY AS NEEDED FOR ERECTILE DYSFUNCTION     Urology: Erectile Dysfunction Agents Failed - 08/15/2022  3:29 PM      Failed - Valid encounter within last 12 months    Recent Outpatient Visits           1 year ago Colon cancer screening   [facility] Donita Brooks, MD   2 years ago Diastasis recti   [facility] Pickard, Priscille Heidelberg, MD   3 years ago General medical exam   [facility] Donita Brooks, MD   5 years ago Essential hypertension   [facility] Donita Brooks, MD   6 years ago Podagra   [facility] Pickard, Priscille Heidelberg, MD              Passed - AST in normal range and within 360 days    AST  Date Value Ref Range Status  07/08/2022 15 10 - 35 U/L Final         Passed - ALT in normal range and within 360 days    ALT  Date Value Ref Range Status  07/08/2022 26 9 - 46 U/L Final         Passed - Last BP in normal range    BP Readings from Last 1 Encounters:  07/15/22 122/80

## 2022-10-10 ENCOUNTER — Other Ambulatory Visit: Payer: Self-pay | Admitting: Family Medicine

## 2022-10-18 ENCOUNTER — Other Ambulatory Visit: Payer: 59

## 2022-10-26 ENCOUNTER — Other Ambulatory Visit: Payer: Self-pay | Admitting: Family Medicine

## 2022-10-27 NOTE — Telephone Encounter (Signed)
Unable to refill per protocol, Rx request is too soon. Last refill 08/17/22 for 90 and 1 refill.  Requested Prescriptions  Pending Prescriptions Disp Refills   losartan (COZAAR) 100 MG tablet [Pharmacy Med Name: LOSARTAN 100MG  TABLETS] 90 tablet 1    Sig: TAKE 1 TABLET(100 MG) BY MOUTH DAILY     Cardiovascular:  Angiotensin Receptor Blockers Failed - 10/26/2022  8:08 AM      Failed - Valid encounter within last 6 months    Recent Outpatient Visits           2 years ago Colon cancer screening   [facility] Donita Brooks, MD   3 years ago Diastasis recti   [facility] Pickard, Priscille Heidelberg, MD   3 years ago General medical exam   [facility] Donita Brooks, MD   5 years ago Essential hypertension   [facility] Donita Brooks, MD   6 years ago Podagra   [facility] Pickard, Priscille Heidelberg, MD              Passed - Cr in normal range and within 180 days    Creat  Date Value Ref Range Status  07/08/2022 1.03 0.70 - 1.30 mg/dL Final   Creatinine, Urine  Date Value Ref Range Status  09/19/2020 71 20 - 320 mg/dL Final         Passed - K in normal range and within 180 days    Potassium  Date Value Ref Range Status  07/08/2022 4.3 3.5 - 5.3 mmol/L Final         Passed - Patient is not pregnant      Passed - Last BP in normal range    BP Readings from Last 1 Encounters:  07/15/22 122/80

## 2022-11-20 ENCOUNTER — Other Ambulatory Visit: Payer: Self-pay | Admitting: Family Medicine

## 2022-11-23 NOTE — Telephone Encounter (Signed)
Last OV 07/15/22, within protocol.  Requested Prescriptions  Pending Prescriptions Disp Refills   sildenafil (VIAGRA) 100 MG tablet [Pharmacy Med Name: SILDENAFIL 100MG  TABLETS] 10 tablet 0    Sig: TAKE 1/2 TO 1 TABLET(50 TO 100 MG) BY MOUTH DAILY AS NEEDED FOR ERECTILE DYSFUNCTION     Urology: Erectile Dysfunction Agents Failed - 11/20/2022  2:14 PM      Failed - Valid encounter within last 12 months    Recent Outpatient Visits           2 years ago Colon cancer screening   [facility] Donita Brooks, MD   3 years ago Diastasis recti   [facility] Pickard, Priscille Heidelberg, MD   3 years ago General medical exam   [facility] Donita Brooks, MD   5 years ago Essential hypertension   [facility] Donita Brooks, MD   6 years ago Podagra   [facility] Pickard, Priscille Heidelberg, MD              Passed - AST in normal range and within 360 days    AST  Date Value Ref Range Status  07/08/2022 15 10 - 35 U/L Final         Passed - ALT in normal range and within 360 days    ALT  Date Value Ref Range Status  07/08/2022 26 9 - 46 U/L Final         Passed - Last BP in normal range    BP Readings from Last 1 Encounters:  07/15/22 122/80

## 2022-11-30 ENCOUNTER — Other Ambulatory Visit: Payer: Self-pay | Admitting: Family Medicine

## 2022-11-30 DIAGNOSIS — I10 Essential (primary) hypertension: Secondary | ICD-10-CM

## 2022-12-01 NOTE — Telephone Encounter (Signed)
Requested Prescriptions  Pending Prescriptions Disp Refills   hydrochlorothiazide (HYDRODIURIL) 25 MG tablet [Pharmacy Med Name: HYDROCHLOROTHIAZIDE 25MG  TABLETS] 60 tablet 0    Sig: TAKE 1 TABLET(25 MG) BY MOUTH DAILY     Cardiovascular: Diuretics - Thiazide Failed - 11/30/2022  6:57 PM      Failed - Valid encounter within last 6 months    Recent Outpatient Visits           2 years ago Colon cancer screening   [facility] Donita Brooks, MD   3 years ago Diastasis recti   [facility] Pickard, Priscille Heidelberg, MD   3 years ago General medical exam   [facility] Donita Brooks, MD   5 years ago Essential hypertension   [facility] Tanya Nones, Priscille Heidelberg, MD   6 years ago Podagra   [facility] Pickard, Priscille Heidelberg, MD              Passed - Cr in normal range and within 180 days    Creat  Date Value Ref Range Status  07/08/2022 1.03 0.70 - 1.30 mg/dL Final   Creatinine, Urine  Date Value Ref Range Status  09/19/2020 71 20 - 320 mg/dL Final         Passed - K in normal range and within 180 days    Potassium  Date Value Ref Range Status  07/08/2022 4.3 3.5 - 5.3 mmol/L Final         Passed - Na in normal range and within 180 days    Sodium  Date Value Ref Range Status  07/08/2022 142 135 - 146 mmol/L Final         Passed - Last BP in normal range    BP Readings from Last 1 Encounters:  07/15/22 122/80

## 2023-01-18 ENCOUNTER — Other Ambulatory Visit: Payer: Self-pay | Admitting: Family Medicine

## 2023-01-18 NOTE — Telephone Encounter (Signed)
Requested medication (s) are due for refill today:   Yes  Requested medication (s) are on the active medication list:   Yes  Future visit scheduled:   No    LOV 07/15/2022 for annual physical   Last ordered: 08/17/2022 #90, 1 refill  Returned because cr and potassium are due so unable to refill  Requested Prescriptions  Pending Prescriptions Disp Refills   losartan (COZAAR) 100 MG tablet [Pharmacy Med Name: LOSARTAN 100MG  TABLETS] 90 tablet 1    Sig: TAKE 1 TABLET(100 MG) BY MOUTH DAILY     Cardiovascular:  Angiotensin Receptor Blockers Failed - 01/18/2023  5:58 AM      Failed - Cr in normal range and within 180 days    Creat  Date Value Ref Range Status  07/08/2022 1.03 0.70 - 1.30 mg/dL Final   Creatinine, Urine  Date Value Ref Range Status  09/19/2020 71 20 - 320 mg/dL Final         Failed - K in normal range and within 180 days    Potassium  Date Value Ref Range Status  07/08/2022 4.3 3.5 - 5.3 mmol/L Final         Failed - Valid encounter within last 6 months    Recent Outpatient Visits           2 years ago Colon cancer screening   [facility] Donita Brooks, MD   3 years ago Diastasis recti   [facility] Medicine Pickard, Priscille Heidelberg, MD   3 years ago General medical exam   [facility] Donita Brooks, MD   5 years ago Essential hypertension   [facility] Tanya Nones, Priscille Heidelberg, MD   6 years ago Podagra   [facility] Pickard, Priscille Heidelberg, MD              Passed - Patient is not pregnant      Passed - Last BP in normal range    BP Readings from Last 1 Encounters:  07/15/22 122/80

## 2023-01-20 ENCOUNTER — Telehealth: Payer: Self-pay

## 2023-01-20 ENCOUNTER — Other Ambulatory Visit: Payer: Self-pay

## 2023-01-20 DIAGNOSIS — I10 Essential (primary) hypertension: Secondary | ICD-10-CM

## 2023-01-20 MED ORDER — LOSARTAN POTASSIUM 100 MG PO TABS
100.0000 mg | ORAL_TABLET | Freq: Every day | ORAL | 1 refills | Status: DC
Start: 2023-01-20 — End: 2023-04-15

## 2023-01-20 NOTE — Telephone Encounter (Signed)
Pt called in to check on status of this refill losartan (COZAAR) 100 MG tablet [[id number]]  LOV: 07/15/22  PHARMACY: [id number] [place], [place] - [address] AT [facility] OF [address] [place] [address] Phone: [phone]  Fax: [phone]    CB#: [phone]

## 2023-01-28 ENCOUNTER — Other Ambulatory Visit: Payer: Self-pay | Admitting: Family Medicine

## 2023-01-28 DIAGNOSIS — I10 Essential (primary) hypertension: Secondary | ICD-10-CM

## 2023-02-04 ENCOUNTER — Other Ambulatory Visit: Payer: Self-pay | Admitting: Family Medicine

## 2023-02-04 DIAGNOSIS — I10 Essential (primary) hypertension: Secondary | ICD-10-CM

## 2023-02-07 NOTE — Telephone Encounter (Signed)
Requested Prescriptions  Pending Prescriptions Disp Refills   hydrochlorothiazide (HYDRODIURIL) 25 MG tablet [Pharmacy Med Name: HYDROCHLOROTHIAZIDE 25MG  TABLETS] 60 tablet 0    Sig: TAKE 1 TABLET(25 MG) BY MOUTH DAILY     Cardiovascular: Diuretics - Thiazide Failed - 02/04/2023  7:22 PM      Failed - Cr in normal range and within 180 days    Creat  Date Value Ref Range Status  07/08/2022 1.03 0.70 - 1.30 mg/dL Final   Creatinine, Urine  Date Value Ref Range Status  09/19/2020 71 20 - 320 mg/dL Final         Failed - K in normal range and within 180 days    Potassium  Date Value Ref Range Status  07/08/2022 4.3 3.5 - 5.3 mmol/L Final         Failed - Na in normal range and within 180 days    Sodium  Date Value Ref Range Status  07/08/2022 142 135 - 146 mmol/L Final         Failed - Valid encounter within last 6 months    Recent Outpatient Visits           2 years ago Colon cancer screening   [facility] Donita Brooks, MD   3 years ago Diastasis recti   [facility] Pickard, Priscille Heidelberg, MD   3 years ago General medical exam   [facility] Donita Brooks, MD   5 years ago Essential hypertension   [facility] Tanya Nones, Priscille Heidelberg, MD   6 years ago Podagra   [facility] Medicine Pickard, Priscille Heidelberg, MD              Passed - Last BP in normal range    BP Readings from Last 1 Encounters:  07/15/22 122/80          sildenafil (VIAGRA) 100 MG tablet [Pharmacy Med Name: SILDENAFIL 100MG  TABLETS] 5 tablet 0    Sig: TAKE 1/2 TO 1 TABLET(50 TO 100 MG) BY MOUTH DAILY AS NEEDED FOR ERECTILE DYSFUNCTION     Urology: Erectile Dysfunction Agents Failed - 02/04/2023  7:22 PM      Failed - Valid encounter within last 12 months    Recent Outpatient Visits           2 years ago Colon cancer screening   [facility] Donita Brooks, MD   3 years ago Diastasis recti   [facility] Pickard, Priscille Heidelberg, MD   3 years ago General medical exam   [facility] Donita Brooks, MD   5 years ago Essential hypertension   [facility] Donita Brooks, MD   6 years ago Podagra   [facility] Pickard, Priscille Heidelberg, MD              Passed - AST in normal range and within 360 days    AST  Date Value Ref Range Status  07/08/2022 15 10 - 35 U/L Final         Passed - ALT in normal range and within 360 days    ALT  Date Value Ref Range Status  07/08/2022 26 9 - 46 U/L Final         Passed - Last BP in normal range    BP Readings from Last 1 Encounters:  07/15/22 122/80

## 2023-03-05 ENCOUNTER — Other Ambulatory Visit: Payer: Self-pay | Admitting: Family Medicine

## 2023-03-08 MED ORDER — SILDENAFIL CITRATE 100 MG PO TABS: 100.0000 mg | ORAL_TABLET | ORAL | 0 refills | Status: DC | PRN

## 2023-04-03 ENCOUNTER — Other Ambulatory Visit: Payer: Self-pay | Admitting: Family Medicine

## 2023-04-03 DIAGNOSIS — I10 Essential (primary) hypertension: Secondary | ICD-10-CM

## 2023-04-04 MED ORDER — HYDROCHLOROTHIAZIDE 25 MG PO TABS: 25.0000 mg | ORAL_TABLET | Freq: Every day | ORAL | 0 refills | Status: DC

## 2023-04-04 MED ORDER — SILDENAFIL CITRATE 100 MG PO TABS: 100.0000 mg | ORAL_TABLET | ORAL | 0 refills | Status: DC | PRN

## 2023-04-15 ENCOUNTER — Encounter: Payer: Self-pay | Admitting: Family Medicine

## 2023-04-15 ENCOUNTER — Ambulatory Visit: Payer: BC Managed Care – PPO | Attending: Family Medicine

## 2023-04-15 ENCOUNTER — Ambulatory Visit: Payer: BC Managed Care – PPO | Admitting: Family Medicine

## 2023-04-15 ENCOUNTER — Other Ambulatory Visit: Payer: Self-pay

## 2023-04-15 VITALS — BP 122/74 | HR 74 | Temp 98.5°F | Ht 65.0 in | Wt 191.4 lb

## 2023-04-15 DIAGNOSIS — G629 Polyneuropathy, unspecified: Secondary | ICD-10-CM

## 2023-04-15 DIAGNOSIS — R7303 Prediabetes: Secondary | ICD-10-CM

## 2023-04-15 DIAGNOSIS — I10 Essential (primary) hypertension: Secondary | ICD-10-CM

## 2023-04-15 DIAGNOSIS — E291 Testicular hypofunction: Secondary | ICD-10-CM

## 2023-04-15 DIAGNOSIS — R002 Palpitations: Secondary | ICD-10-CM

## 2023-04-15 MED ORDER — LOSARTAN POTASSIUM 100 MG PO TABS: 100.0000 mg | ORAL_TABLET | Freq: Every day | ORAL | 4 refills | Status: DC

## 2023-04-15 MED ORDER — SILDENAFIL CITRATE 100 MG PO TABS: 100.0000 mg | ORAL_TABLET | ORAL | 4 refills | Status: DC | PRN

## 2023-04-15 MED ORDER — HYDROCHLOROTHIAZIDE 25 MG PO TABS: 25.0000 mg | ORAL_TABLET | Freq: Every day | ORAL | 4 refills | Status: DC

## 2023-04-15 NOTE — Progress Notes (Signed)
 Subjective:    Patient ID: Nathaniel Parrish, male    DOB: Dec 14, 1970, 53 y.o.   MRN: [id number]   Patient has had 2 episodes where his heart rate suddenly reached 130.  He states that he had his desk working on his computer.  There was no specific cause.  He denied any shortness of breath.  The racing heart rate did not seem irregular.  It lasted for several minutes although he is uncertain.  He denies any syncope or near syncope.  He denies any angina.  He does have a family history of heart disease in his father and his mother.  His blood pressure today is excellent at 122/74.  He does complain of neuropathy in both feet.  This is characterized by numbness and tingling.  It is limited to the feet.  He denies any low back pain or lumbar radiculopathy.  He does complain of fatigue.  He has a history of prediabetes.  Past Medical History:  Diagnosis Date   Hypertension    Hypogonadism in male    Low testosterone     Prediabetes    Past Surgical History:  Procedure Laterality Date   NASAL SEPTUM SURGERY     Current Outpatient Medications on File Prior to Visit  Medication Sig Dispense Refill   BD ECLIPSE SYRINGE 25G X 1 3 ML MISC USE AS DIRECTED TO INJECT TESTOSTERONE  IN THE MUSCLE EVERY 14 DAYS (Patient not taking: Reported on 04/15/2023) 10 each 11   terbinafine  (LAMISIL ) 250 MG tablet TAKE 1 TABLET(250 MG) BY MOUTH DAILY (Patient not taking: Reported on 04/15/2023) 30 tablet 2   Testosterone  20.25 MG/ACT (1.62%) GEL Place 2 Pump onto the skin daily. (Patient not taking: Reported on 04/15/2023) 75 g 2   No current facility-administered medications on file prior to visit.   No Known Allergies Social History   Socioeconomic History   Marital status: Married    Spouse name: Not on file   Number of children: Not on file   Years of education: Not on file   Highest education level: Not on file  Occupational History   Not on file  Tobacco Use   Smoking status: Never   Smokeless tobacco:  Current    Types: Chew  Substance and Sexual Activity   Alcohol use: No   Drug use: No   Sexual activity: Yes    Comment: married  Other Topics Concern   Not on file  Social History Narrative   Not on file   Social Drivers of Health   Financial Resource Strain: Not on file  Food Insecurity: Not on file  Transportation Needs: Not on file  Physical Activity: Not on file  Stress: Not on file  Social Connections: Not on file  Intimate Partner Violence: Not on file     Review of Systems  All other systems reviewed and are negative.      Objective:   Physical Exam Vitals reviewed.  Constitutional:      General: He is not in acute distress.    Appearance: He is well-developed. He is not diaphoretic.  HENT:     Head: Normocephalic and atraumatic.     Right Ear: External ear normal.     Left Ear: External ear normal.     Nose: Nose normal.     Mouth/Throat:     Pharynx: No oropharyngeal exudate.  Eyes:     General: No scleral icterus.       Right eye: No discharge.  Left eye: No discharge.     Conjunctiva/sclera: Conjunctivae normal.     Pupils: Pupils are equal, round, and reactive to light.  Neck:     Thyroid: No thyromegaly.     Vascular: No JVD.  Cardiovascular:     Rate and Rhythm: Normal rate and regular rhythm.     Heart sounds: Normal heart sounds. No murmur heard. Pulmonary:     Effort: Pulmonary effort is normal. No respiratory distress.     Breath sounds: Normal breath sounds. No wheezing or rales.  Chest:     Chest wall: No tenderness.  Abdominal:     General: Bowel sounds are normal. There is no distension.     Palpations: Abdomen is soft. There is no mass.     Tenderness: There is no abdominal tenderness. There is no guarding or rebound.  Musculoskeletal:     Cervical back: Neck supple.  Lymphadenopathy:     Cervical: No cervical adenopathy.  Skin:    General: Skin is warm.     Findings: Rash present.  Neurological:     Mental Status:  He is alert and oriented to person, place, and time.     Cranial Nerves: No cranial nerve deficit.     Motor: No abnormal muscle tone.     Coordination: Coordination normal.     Deep Tendon Reflexes: Reflexes normal.  Psychiatric:        Behavior: Behavior normal.        Thought Content: Thought content normal.        Judgment: Judgment normal.   Patient has numerous actinic keratoses on his scalp and forehead.  He sees dermatology every 6 months.        Assessment & Plan:  Essential hypertension - Plan: CBC with Differential/Platelet, COMPLETE METABOLIC PANEL WITH GFR, Lipid panel, TSH, PSA  Hypogonadism in male - Plan: CBC with Differential/Platelet, COMPLETE METABOLIC PANEL WITH GFR, Lipid panel, TSH, PSA  Prediabetes - Plan: CBC with Differential/Platelet, COMPLETE METABOLIC PANEL WITH GFR, Lipid panel, TSH, PSA  Palpitations - Plan: LONG TERM MONITOR (3-14 DAYS), CT CARDIAC SCORING (SELF PAY ONLY)  Neuropathy - Plan: Vitamin B12 I am concerned the patient had either SVT or perhaps atrial fibrillation.  I have recommended a 14-day cardiac monitor to determine the type of arrhythmia.  Patient agrees.  Given his family history, he would also like to proceed with a cardiac CT scan to evaluate for the presence of any atherosclerosis.  Blood pressure today is acceptable.  Check CBC CMP lipid panel and TSH.  Due to neuropathy I will add a B12.  If sugar is elevated I will add a hemoglobin A1c.

## 2023-04-15 NOTE — Progress Notes (Unsigned)
 EP to read.

## 2023-04-16 LAB — COMPLETE METABOLIC PANEL WITH GFR
AG Ratio: 1.8 (calc) (ref 1.0–2.5)
ALT: 40 U/L (ref 9–46)
AST: 22 U/L (ref 10–35)
Albumin: 4.8 g/dL (ref 3.6–5.1)
Alkaline phosphatase (APISO): 84 U/L (ref 35–144)
BUN: 23 mg/dL (ref 7–25)
CO2: 28 mmol/L (ref 20–32)
Calcium: 10.2 mg/dL (ref 8.6–10.3)
Chloride: 100 mmol/L (ref 98–110)
Creat: 0.94 mg/dL (ref 0.70–1.30)
Globulin: 2.6 g/dL (ref 1.9–3.7)
Glucose, Bld: 121 mg/dL — ABNORMAL HIGH (ref 65–99)
Potassium: 4.3 mmol/L (ref 3.5–5.3)
Sodium: 140 mmol/L (ref 135–146)
Total Bilirubin: 0.6 mg/dL (ref 0.2–1.2)
Total Protein: 7.4 g/dL (ref 6.1–8.1)
eGFR: 97 mL/min/{1.73_m2} (ref >=60)

## 2023-04-16 LAB — CBC WITH DIFFERENTIAL/PLATELET
Absolute Lymphocytes: 2376 {cells}/uL (ref 850–3900)
Absolute Monocytes: 685 {cells}/uL (ref 200–950)
Basophils Absolute: 53 {cells}/uL (ref 0–200)
Basophils Relative: 0.6 %
Eosinophils Absolute: 160 {cells}/uL (ref 15–500)
Eosinophils Relative: 1.8 %
HCT: 44.9 % (ref 38.5–50.0)
Hemoglobin: 14.9 g/dL (ref 13.2–17.1)
MCH: 30.2 pg (ref 27.0–33.0)
MCHC: 33.2 g/dL (ref 32.0–36.0)
MCV: 91.1 fL (ref 80.0–100.0)
MPV: 9.6 fL (ref 7.5–12.5)
Monocytes Relative: 7.7 %
Neutro Abs: 5625 {cells}/uL (ref 1500–7800)
Neutrophils Relative %: 63.2 %
Platelets: 287 10*3/uL (ref 140–400)
RBC: 4.93 10*6/uL (ref 4.20–5.80)
RDW: 12.2 % (ref 11.0–15.0)
Total Lymphocyte: 26.7 %
WBC: 8.9 10*3/uL (ref 3.8–10.8)

## 2023-04-16 LAB — PSA: PSA: 2.47 ng/mL (ref <=4.00)

## 2023-04-16 LAB — LIPID PANEL
Cholesterol: 186 mg/dL (ref <200)
HDL: 59 mg/dL (ref >=40)
LDL Cholesterol (Calc): 105 mg/dL — ABNORMAL HIGH
Non-HDL Cholesterol (Calc): 127 mg/dL (ref <130)
Total CHOL/HDL Ratio: 3.2 (calc) (ref <5.0)
Triglycerides: 128 mg/dL (ref <150)

## 2023-04-16 LAB — VITAMIN B12: Vitamin B-12: 693 pg/mL (ref 200–1100)

## 2023-04-16 LAB — TSH: TSH: 1.06 m[IU]/L (ref 0.40–4.50)

## 2023-04-18 ENCOUNTER — Encounter (HOSPITAL_COMMUNITY): Payer: Self-pay | Admitting: *Deleted

## 2023-04-18 ENCOUNTER — Emergency Department (HOSPITAL_COMMUNITY)
Admission: EM | Admit: 2023-04-18 | Discharge: 2023-04-18 | Disposition: A | Discharge status: Home / Self Care | Payer: BC Managed Care – PPO | Attending: Emergency Medicine | Admitting: Emergency Medicine

## 2023-04-18 ENCOUNTER — Other Ambulatory Visit: Payer: Self-pay

## 2023-04-18 ENCOUNTER — Emergency Department (HOSPITAL_COMMUNITY): Payer: BC Managed Care – PPO

## 2023-04-18 DIAGNOSIS — R079 Chest pain, unspecified: Secondary | ICD-10-CM | POA: Insufficient documentation

## 2023-04-18 DIAGNOSIS — R002 Palpitations: Secondary | ICD-10-CM

## 2023-04-18 DIAGNOSIS — R7303 Prediabetes: Secondary | ICD-10-CM | POA: Diagnosis not present

## 2023-04-18 DIAGNOSIS — K760 Fatty (change of) liver, not elsewhere classified: Secondary | ICD-10-CM | POA: Diagnosis not present

## 2023-04-18 DIAGNOSIS — I10 Essential (primary) hypertension: Secondary | ICD-10-CM | POA: Diagnosis not present

## 2023-04-18 DIAGNOSIS — R200 Anesthesia of skin: Secondary | ICD-10-CM | POA: Diagnosis not present

## 2023-04-18 DIAGNOSIS — I2510 Atherosclerotic heart disease of native coronary artery without angina pectoris: Secondary | ICD-10-CM | POA: Diagnosis not present

## 2023-04-18 LAB — CBC
HCT: 44.1 % (ref 39.0–52.0)
Hemoglobin: 14.9 g/dL (ref 13.0–17.0)
MCH: 30.2 pg (ref 26.0–34.0)
MCHC: 33.8 g/dL (ref 30.0–36.0)
MCV: 89.3 fL (ref 80.0–100.0)
Platelets: 282 10*3/uL (ref 150–400)
RBC: 4.94 MIL/uL (ref 4.22–5.81)
RDW: 12.4 % (ref 11.5–15.5)
WBC: 10.6 10*3/uL — ABNORMAL HIGH (ref 4.0–10.5)
nRBC: 0 % (ref 0.0–0.2)

## 2023-04-18 LAB — BASIC METABOLIC PANEL
Anion gap: 12 (ref 5–15)
BUN: 19 mg/dL (ref 6–20)
CO2: 24 mmol/L (ref 22–32)
Calcium: 9.8 mg/dL (ref 8.9–10.3)
Chloride: 102 mmol/L (ref 98–111)
Creatinine, Ser: 0.84 mg/dL (ref 0.61–1.24)
GFR, Estimated: >60 mL/min (ref >60)
Glucose, Bld: 98 mg/dL (ref 70–99)
Potassium: 3.8 mmol/L (ref 3.5–5.1)
Sodium: 138 mmol/L (ref 135–145)

## 2023-04-18 LAB — TROPONIN I (HIGH SENSITIVITY)
Troponin I (High Sensitivity): <2 ng/L (ref <18)
Troponin I (High Sensitivity): 3 ng/L (ref <18)

## 2023-04-18 MED ORDER — IOHEXOL 350 MG/ML SOLN
100.0000 mL | Freq: Once | INTRAVENOUS | Status: AC | PRN
  Administered 2023-04-18: 100 mL via INTRAVENOUS

## 2023-04-18 MED ORDER — ASPIRIN 325 MG PO TABS: 325.0000 mg | ORAL_TABLET | Freq: Every day | ORAL | Status: DC

## 2023-04-18 NOTE — ED Notes (Signed)
 Pt fed meal tray by this RN.

## 2023-04-18 NOTE — Discharge Instructions (Addendum)
 You have been seen here in the emergency department for chest pain.  You have small calcifications in your left coronary artery.  Right now we feel you are safe to be discharged home.  We have placed a referral with cardiology, I also encourage you to get that cardiac monitoring system as well and follow-up with cardiologist.  If you already have a cardiologist to call them and tell them what happened and get you in for stress testing which is where you run on a treadmill as well as other additional test.  If your pain changes worsens or you develop any new symptoms please come back to the emergency department

## 2023-04-18 NOTE — ED Provider Notes (Signed)
 [place] [facility] AT [facility] Provider Note  HPI   Nathaniel Parrish is a 53 y.o. male patient with a PMHx of hypertension who is here today with concern for chest pain.  Patient states that over the past couple weeks, he has noticed his Apple Watch telling him that his heart rate is in the 130s 140s and that is without any exertion or pain.  He has had really no symptoms during these episodes.  Today, just prior to arrival a few hours ago, he was driving, started having strong sharp back pain, and then eventually started shooting to the chest.  He still having this pain, and is under his left breast, and his back pain is resolved.  He has never had a pain like this before and it has concerned him which is why he came here for evaluation.  No fevers chills cough hemoptysis    ROS Negative except as per HPI   Medical Decision Making   Upon presentation, the patient is afebrile hemodynamically stable, initially heart rate was in the 112's, and tachypnea to the 24, however these both resolved with getting him in the hospital bed, those vitals were obtained right when he physically walked in the door of the emergency room.  He has been on room air throughout this entire time.  Labs were obtained, his metabolic panel and hematologic panel largely normal other than a leukocytosis of 10.6 which is probably insignificant.  He has had 2 negative troponins, and ECG was reviewed which shows sinus tachycardia but no other signs of ischemia or arrhythmia.  Chest x-ray was clear  For this patient with a normal ECG and negative troponins, I do not think this is ACS, I do not ask patient has pulmonary embolism no hemoptysis no history of cancer, no calf pain no history of DVT or PE, we cannot PERC him out due to his age, however he has a very low risk I do not think this pulmonary embolism is representative of his pain.  For the patient, we did pursue an advanced aortic dissection scan,  given his back pain chest pain, does endorse endorse some a vague numbness and tingling of his bilateral arms when this happened.  Ultimately his dissection scan was negative, did show some mild calcifications of the left anterior descending artery   1. Normal contour and caliber of the thoracic and abdominal aorta.  No evidence of aneurysm, dissection, or other acute aortic  pathology. No significant atherosclerosis.  2. Mild diffuse bilateral bronchial wall thickening. Background of  fine centrilobular pulmonary nodularity, most concentrated in the  lung apices. Findings are most commonly seen in smoking-related  respiratory bronchiolitis.  3. Hepatic steatosis.  4. Coronary artery disease.    But together, this patient has a hear score of 3, low risk for ACS, and I think he can be safely discharged, we have ruled out aortic dissection, and I do not think this pulmonary embolism, his symptoms have drastically improved, of note, given the intermittent tachycardia noted on his Apple Watch, he has a upcoming Zio patch that is coming to him by mail in the next 2 days, and then will have cardiology follow-up.  Additionally, I am going to be placing emergent cardiology referral that he had stress test I do not think he needs to stay for this and I think get this done safely outpatient he has supportive care at home with his wife, and will come back if any acutely changes.  At this time, I feel that the patient is medically cleared for discharge and have discussed this with my attending who agrees.  I discussed with the patient and/or family my overall assessment, including my physical exam, labs, imaging, other diagnostic tests, and therapeutics given.  All questions answered and understanding is expressed.  I have instructed to call PCP to establish an outpatient appointment after this ED visit, and necessary specialty follow up if needed. I gave strict return precautions to come back to the ED  including fevers, chills, severe pain, worsening of symptoms, return of symptoms, new and concerning symptoms, inability to tolerate p.o. intake, among others. I specifically stated to return if symptoms worsen or return.    1. Prediabetes   2. Chest pain, unspecified type     @DISPOSITION @  Rx / DC Orders ED Discharge Orders          Ordered    Ambulatory referral to Cardiology       Comments: If you have not heard from the Cardiology office within the next 72 hours please call [phone].   04/18/23 1858             Past Medical History:  Diagnosis Date   Hypertension    Hypogonadism in male    Low testosterone     Prediabetes    Past Surgical History:  Procedure Laterality Date   NASAL SEPTUM SURGERY     Family History  Problem Relation Age of Onset   Hypertension Mother    Heart disease Father        mi at 36, cabg at 63   Heart disease Paternal Aunt        mi at 22   Heart disease Paternal Uncle    Colon cancer Neg Hx    Colon polyps Neg Hx    Esophageal cancer Neg Hx    Rectal cancer Neg Hx    Stomach cancer Neg Hx    Social History   Socioeconomic History   Marital status: Married    Spouse name: Not on file   Number of children: Not on file   Years of education: Not on file   Highest education level: Not on file  Occupational History   Not on file  Tobacco Use   Smoking status: Never   Smokeless tobacco: Current    Types: Chew  Substance and Sexual Activity   Alcohol use: No   Drug use: No   Sexual activity: Yes    Comment: married  Other Topics Concern   Not on file  Social History Narrative   Not on file   Social Drivers of Health   Financial Resource Strain: Not on file  Food Insecurity: Not on file  Transportation Needs: Not on file  Physical Activity: Not on file  Stress: Not on file  Social Connections: Not on file  Intimate Partner Violence: Not on file     Physical Exam   Vitals:   04/18/23 1623 04/18/23 1848  04/18/23 1851 04/18/23 1914  BP: 139/78  (!) 130/95 130/88  Pulse: 95  77 80  Resp: 16  15 18   Temp:   98.7 F (37.1 C) 98.8 F (37.1 C)  TempSrc:   Oral Oral  SpO2: 97%  96% 100%  Weight:  86.8 kg    Height:  5' 5 (1.651 m)      Physical Exam Vitals and nursing note reviewed.  Constitutional:      General: He is not in acute  distress.    Appearance: Normal appearance. He is well-developed. He is not ill-appearing or toxic-appearing.  HENT:     Head: Normocephalic and atraumatic.     Right Ear: External ear normal.     Left Ear: External ear normal.     Nose: Nose normal.     Mouth/Throat:     Mouth: Mucous membranes are moist.  Eyes:     Extraocular Movements: Extraocular movements intact.     Pupils: Pupils are equal, round, and reactive to light.  Cardiovascular:     Rate and Rhythm: Normal rate.     Pulses: Normal pulses.  Pulmonary:     Effort: Pulmonary effort is normal. No respiratory distress.     Breath sounds: Normal breath sounds. No stridor. No wheezing, rhonchi or rales.  Abdominal:     Palpations: Abdomen is soft.     Tenderness: There is no abdominal tenderness. There is no right CVA tenderness or left CVA tenderness.  Musculoskeletal:        General: Normal range of motion.     Cervical back: Normal range of motion and neck supple.     Comments: There is no rash on the chest, there is no chest wall tenderness  Skin:    General: Skin is warm and dry.     Capillary Refill: Capillary refill takes less than 2 seconds.     Comments: Patient has 2+ distal pulses in arms and legs bilaterally  Neurological:     General: No focal deficit present.     Mental Status: He is alert and oriented to person, place, and time. Mental status is at baseline.  Psychiatric:        Mood and Affect: Mood normal.      Procedures   If procedures were preformed on this patient, they are listed below:  Procedures  The patient was seen, evaluated, and treated in  conjunction with the attending physician, who voiced agreement in the care provided.  Note generated using Dragon voice dictation software and may contain dictation errors. Please contact me for any clarification or with any questions.   Electronically signed by:  Fairy Kerby Revere, M.D. (PGY-2)    Revere Fairy, MD 04/18/23 7690    Tonia Chew, MD 04/18/23 2358

## 2023-04-18 NOTE — ED Triage Notes (Signed)
 PT states woke up this am with pain between his shoulder blades and then as he was driving the pain moved around to his chest.  States his hands went numb. PT states left arm still feel numb, grip strength strong bilaterally. PT states hurts with movement.  States pain is to the left side of his chest. Hurts with deep breath. No recent calf pain.

## 2023-04-18 NOTE — ED Provider Triage Note (Addendum)
 Emergency Medicine Provider Triage Evaluation Note  Nathaniel Parrish , a 53 y.o. male  was evaluated in triage.  Pt complains of chest pain 6 hours radiating into L arm and numbness in face, worse with movement and deep breaths, endorses mild exertional dyspnea. Hx of HTN. Family Hx of MI and Afib. Visited PCP and was supposed to go on heart monitor for intermittent elevated HR.   Review of Systems  Positive: Nausea Negative: Fever, vomiting, abdominal pain, diarrhea, lower limb swelling, long trips, leg pain.   Physical Exam  BP (!) 132/93 (BP Location: Right Arm)   Pulse (!) 112   Temp 98.8 F (37.1 C) (Oral)   Resp (!) 24   SpO2 96%  Gen:   Awake, no distress   Resp:  Normal effort  MSK:   Moves extremities without difficulty  Other:    Medical Decision Making  Medically screening exam initiated at 12:26 PM.  Appropriate orders placed.  PEARCE LITTLEFIELD was informed that the remainder of the evaluation will be completed by another provider, this initial triage assessment does not replace that evaluation, and the importance of remaining in the ED until their evaluation is complete.     Beola Terrall RAMAN, PA-C 04/18/23 1231    Beola Terrall RAMAN, [place] 04/18/23 1235

## 2023-04-22 ENCOUNTER — Encounter: Payer: Self-pay | Admitting: Family Medicine

## 2023-04-22 ENCOUNTER — Ambulatory Visit: Payer: BC Managed Care – PPO | Admitting: Family Medicine

## 2023-04-22 VITALS — BP 132/70 | HR 98 | Temp 98.3°F | Ht 65.0 in | Wt 189.6 lb

## 2023-04-22 DIAGNOSIS — R079 Chest pain, unspecified: Secondary | ICD-10-CM

## 2023-04-22 MED ORDER — ROSUVASTATIN CALCIUM 20 MG PO TABS: 20.0000 mg | ORAL_TABLET | Freq: Every day | ORAL | 3 refills | Status: DC

## 2023-04-22 MED ORDER — NITROGLYCERIN 0.4 MG SL SUBL: 0.4000 mg | SUBLINGUAL_TABLET | SUBLINGUAL | 3 refills | Status: AC | PRN

## 2023-04-22 NOTE — Progress Notes (Signed)
 Subjective:    Patient ID: Nathaniel Parrish, male    DOB: Nov 02, 1970, 53 y.o.   MRN: [id number]  Was seen in ER 1/7.  I reviewed EKG which showed no stemi or evidence of ischemia.  Troponins were negative x 2.  CT did show evidence of coronary artery disease in left coronary artery but no PE or dissection.  Here for follow up.  Chest pain occurred while the patient was driving to [place] in his truck.  It radiated into his shoulder blades.  It lasted hours prompting him to go to the emergency room.  He states the next day he was feeling fine.  He was walking up 5 flights of steps at work without any difficulty.  The pain returned on Wednesday night while he was sitting at home doing nothing.  Therefore does not sound like typical angina. Past Medical History:  Diagnosis Date   Hypertension    Hypogonadism in male    Low testosterone     Prediabetes    Past Surgical History:  Procedure Laterality Date   NASAL SEPTUM SURGERY     Current Outpatient Medications on File Prior to Visit  Medication Sig Dispense Refill   hydrochlorothiazide  (HYDRODIURIL ) 25 MG tablet Take 1 tablet (25 mg total) by mouth daily. 90 tablet 4   losartan  (COZAAR ) 100 MG tablet Take 1 tablet (100 mg total) by mouth daily. 90 tablet 4   Multiple Vitamins-Minerals (MULTIVITAMIN MEN) TABS Take 1 tablet by mouth daily.     Omega-3 Fatty Acids (FISH OIL PO) Take 1 capsule by mouth daily.     omeprazole (PRILOSEC OTC) 20 MG tablet Take 20 mg by mouth daily.     POTASSIUM PO Take 1 tablet by mouth daily. OTC     sildenafil  (VIAGRA ) 100 MG tablet Take 1 tablet (100 mg total) by mouth as needed for erectile dysfunction. 90 tablet 4   No current facility-administered medications on file prior to visit.   No Known Allergies Social History   Socioeconomic History   Marital status: Married    Spouse name: Not on file   Number of children: Not on file   Years of education: Not on file   Highest education level: Some  college, no degree  Occupational History   Not on file  Tobacco Use   Smoking status: Never   Smokeless tobacco: Current    Types: Chew  Substance and Sexual Activity   Alcohol use: No   Drug use: No   Sexual activity: Yes    Comment: married  Other Topics Concern   Not on file  Social History Narrative   Not on file   Social Drivers of Health   Financial Resource Strain: Low Risk  (04/21/2023)   Overall Financial Resource Strain (CARDIA)    Difficulty of Paying Living Expenses: Not hard at all  Food Insecurity: No Food Insecurity (04/21/2023)   Hunger Vital Sign    Worried About Running Out of Food in the Last Year: Never true    Ran Out of Food in the Last Year: Never true  Transportation Needs: No Transportation Needs (04/21/2023)   PRAPARE - Administrator, Civil Service (Medical): No    Lack of Transportation (Non-Medical): No  Physical Activity: Inactive (04/21/2023)   Exercise Vital Sign    Days of Exercise per Week: 3 days    Minutes of Exercise per Session: 0 min  Stress: Stress Concern Present (04/21/2023)   [employer] of Occupational  Health - Occupational Stress Questionnaire    Feeling of Stress : To some extent  Social Connections: Socially Integrated (04/21/2023)   Social Connection and Isolation Panel [NHANES]    Frequency of Communication with Friends and Family: More than three times a week    Frequency of Social Gatherings with Friends and Family: Once a week    Attends Religious Services: 1 to 4 times per year    Active Member of [employer] or Organizations: Yes    Attends Banker Meetings: 1 to 4 times per year    Marital Status: Married  Catering Manager Violence: Not on file     Review of Systems  All other systems reviewed and are negative.      Objective:   Physical Exam Vitals reviewed.  Constitutional:      General: He is not in acute distress.    Appearance: He is well-developed. He is not diaphoretic.  HENT:      Head: Normocephalic and atraumatic.     Right Ear: External ear normal.     Left Ear: External ear normal.     Nose: Nose normal.     Mouth/Throat:     Pharynx: No oropharyngeal exudate.  Eyes:     General: No scleral icterus.       Right eye: No discharge.        Left eye: No discharge.     Conjunctiva/sclera: Conjunctivae normal.     Pupils: Pupils are equal, round, and reactive to light.  Neck:     Thyroid: No thyromegaly.     Vascular: No JVD.  Cardiovascular:     Rate and Rhythm: Normal rate and regular rhythm.     Heart sounds: Normal heart sounds. No murmur heard. Pulmonary:     Effort: Pulmonary effort is normal. No respiratory distress.     Breath sounds: Normal breath sounds. No wheezing or rales.  Chest:     Chest wall: No tenderness.  Abdominal:     General: Bowel sounds are normal. There is no distension.     Palpations: Abdomen is soft. There is no mass.     Tenderness: There is no abdominal tenderness. There is no guarding or rebound.  Musculoskeletal:     Cervical back: Neck supple.  Lymphadenopathy:     Cervical: No cervical adenopathy.  Skin:    General: Skin is warm.  Neurological:     Mental Status: He is alert and oriented to person, place, and time.     Cranial Nerves: No cranial nerve deficit.     Motor: No abnormal muscle tone.     Coordination: Coordination normal.     Deep Tendon Reflexes: Reflexes normal.  Psychiatric:        Behavior: Behavior normal.        Thought Content: Thought content normal.        Judgment: Judgment normal.        Assessment & Plan:  Chest pain, unspecified type - Plan: Ambulatory referral to Cardiology Pain is atypical however I would recommend cardiology evaluation given the presence of coronary artery disease seen on CT scan.  I did give the patient aspirin  81 mg daily and Crestor  20 mg daily until evaluated further.  Also gave him a prescription for nitroglycerin  in case the chest pain returns

## 2023-05-01 ENCOUNTER — Encounter: Payer: Self-pay | Admitting: Family Medicine

## 2023-05-01 ENCOUNTER — Other Ambulatory Visit: Payer: Self-pay | Admitting: Family Medicine

## 2023-05-01 DIAGNOSIS — I10 Essential (primary) hypertension: Secondary | ICD-10-CM

## 2023-05-10 DIAGNOSIS — R002 Palpitations: Secondary | ICD-10-CM | POA: Diagnosis not present

## 2023-06-02 ENCOUNTER — Other Ambulatory Visit: Payer: Self-pay

## 2023-06-02 MED ORDER — SILDENAFIL CITRATE 100 MG PO TABS: 100.0000 mg | ORAL_TABLET | ORAL | 4 refills | Status: AC | PRN

## 2023-06-15 ENCOUNTER — Ambulatory Visit: Payer: Self-pay | Admitting: Family Medicine

## 2023-06-15 ENCOUNTER — Ambulatory Visit: Admitting: Family Medicine

## 2023-06-15 ENCOUNTER — Encounter: Payer: Self-pay | Admitting: Family Medicine

## 2023-06-15 VITALS — BP 122/78 | HR 77 | Temp 98.2°F | Ht 65.0 in | Wt 188.5 lb

## 2023-06-15 DIAGNOSIS — R202 Paresthesia of skin: Secondary | ICD-10-CM

## 2023-06-15 DIAGNOSIS — R200 Anesthesia of skin: Secondary | ICD-10-CM | POA: Diagnosis not present

## 2023-06-15 DIAGNOSIS — M79642 Pain in left hand: Secondary | ICD-10-CM | POA: Diagnosis not present

## 2023-06-15 MED ORDER — PREDNISONE 10 MG (21) PO TBPK: ORAL_TABLET | ORAL | 0 refills | Status: AC

## 2023-06-15 NOTE — Progress Notes (Signed)
 Patient Office Visit  Assessment & Plan:  Left hand pain -     DG Hand Complete Left; Future -     predniSONE; Use as directed.  Dispense: 21 each; Refill: 0  Numbness and tingling in left hand -     Nerve conduction test; Future   Follow-up on x-ray over read and notify patient.  Start prednisone today take with food.  No NSAIDs while on prednisone.  No improvement he is to notify us.  Nerve conduction studies ordered to rule out carpal tunnel syndrome.  If no better may need  to see hand specialist re this. He may benefit from gabapentin at nighttime in the future.  We did not discuss this today. No follow-ups on file.   Subjective:    Patient ID: Nathaniel Parrish, male    DOB: 12/21/1970  Age: 53 y.o. MRN: [id number]  Chief Complaint  Patient presents with   Hand Pain    NKI. Pain in left hand x 4 days.     HPI Left hand pain- has noticed acute onset of left hand pain with no grip and no strength in it, numbness and tingling first 3 fingers for the past 4 days.  Patient right hand dominant. 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 Pt took Advil yesterday which helped a little bit. Today the pain level is slightly better but still bothersome. If he does nothing his hand is fine but once he moves it he has pain.  Pt has swelling on top of hand which is not going away. No recent trauma.  No fever or chills.  No bite that he is aware of.  Patient is unable to pick up a coffee cup with his left hand because of no strength in it. Left hand Numbness and tingling -has been going on for several months.  Patient has never been checked for carpal tunnel syndrome.  Patient has noticed the numbness and tingling in the thumb index and middle finger. Pt is right hand dominant.   The 10-year ASCVD risk score (Arnett DK, et al., 2019) is: 4.3%  Past Medical History:  Diagnosis Date   Hypertension    Hypogonadism in male    Low testosterone    Prediabetes    Past Surgical History:   Procedure Laterality Date   NASAL SEPTUM SURGERY     Social History   Tobacco Use   Smoking status: Never   Smokeless tobacco: Current    Types: Chew  Substance Use Topics   Alcohol use: No   Drug use: No   Family History  Problem Relation Age of Onset   Hypertension Mother    Heart disease Father        mi at 66, cabg at 72   Heart disease Paternal Aunt        mi at 78   Heart disease Paternal Uncle    Colon cancer Neg Hx    Colon polyps Neg Hx    Esophageal cancer Neg Hx    Rectal cancer Neg Hx    Stomach cancer Neg Hx    No Known Allergies  ROS    Objective:    BP 122/78   Pulse 77   Temp 98.2 F (36.8 C)   Ht 5\' 5"  (1.651 m)   Wt 188 lb 8 oz (85.5 kg)   SpO2 99%   BMI 31.37 kg/m  BP Readings from Last 3 Encounters:  06/15/23 122/78  04/22/23 132/70  04/18/23 130/88   Wt Readings from Last 3 Encounters:  06/15/23 188 lb 8 oz (85.5 kg)  04/22/23 189 lb 9.6 oz (86 kg)  04/18/23 191 lb 6 oz (86.8 kg)    Physical Exam Vitals and nursing note reviewed.  Constitutional:      Appearance: Normal appearance.  HENT:     Head: Normocephalic.     Right Ear: Tympanic membrane, ear canal and external ear normal.     Left Ear: Tympanic membrane, ear canal and external ear normal.  Eyes:     Extraocular Movements: Extraocular movements intact.     Conjunctiva/sclera: Conjunctivae normal.     Pupils: Pupils are equal, round, and reactive to light.  Cardiovascular:     Rate and Rhythm: Normal rate and regular rhythm.     Heart sounds: Normal heart sounds.  Pulmonary:     Effort: Pulmonary effort is normal.     Breath sounds: Normal breath sounds.  Musculoskeletal:     Left hand: Swelling, tenderness and bony tenderness present. Decreased range of motion. Decreased strength.     Right lower leg: No edema.     Left lower leg: No edema.     Comments: Left hand- Swelling and slightly warm to the touch top of hand. Left hand- Pt has positive finklestein's. Pt  has Negative Tinel's, Positive Phalen's, diminished grip strength left side compared to right side.   Neurological:     General: No focal deficit present.     Mental Status: He is alert and oriented to person, place, and time.  Psychiatric:        Mood and Affect: Mood normal.        Behavior: Behavior normal.      No results found for any visits on 06/15/23.

## 2023-06-15 NOTE — Telephone Encounter (Signed)
  Chief Complaint: L hand pain Symptoms: pain, tingling in fingers, swelling, minimal redness if any,  Frequency: 4 fdays Pertinent Negatives: Patient denies fever, injury, abrasions/openings in skin,   Disposition: [] ED /[] Urgent Care (no appt availability in office) / [x] Appointment(In office/virtual)/ []  [facility]/ [] Home Care/ [] Refused Recommended Disposition /[] [facility]/ []  Follow-up with PCP  Additional Notes: Pt states that he woke 4 days ago with hand pain.  Pt states that it is swollen, may be a tad warm and is minimally red if red at all. 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  Pt states that he is unable to grip anything, or perform simple tasks such as tying his shoe. Pt states that he does have tingling in his fingers.   Copied from CRM [id number]. Topic: Clinical - Red Word Triage >> Jun 15, 2023  8:12 AM Tiffany B wrote: Red Word that prompted transfer to Nurse Triage: Patient experiencing left hand numbness, pain, swelling  and unable to grip for 4 days. Reason for Disposition  [1] SEVERE pain (e.g., excruciating, unable to use hand at all) AND [2] not improved after 2 hours of pain medicine  Answer Assessment - Initial Assessment Questions 1. ONSET: "When did the pain start?"     4 days ago 2. LOCATION: "Where is the pain located?"     L hand/fingers 3. PAIN: "How bad is the pain?" (Scale 1-10; or mild, moderate, severe)   - MILD (1-3): doesn't interfere with normal activities   - MODERATE (4-7): interferes with normal activities (e.g., work or school) or awakens from sleep   - SEVERE (8-10): excruciating pain, unable to use hand at all     Dulling pain 4. 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 CAUSE: "What do you think is causing the pain?"     unsure 6. AGGRAVATING  FACTORS: "What makes the pain worse?" (e.g., using computer)     Using it makes it worse 7. OTHER SYMPTOMS: "Do you have any other symptoms?" (e.g., neck pain, swelling, rash, numbness, fever)     Swelling, minimal redness if any, pain,  Protocols used: Hand and Wrist Pain-A-AH

## 2023-07-19 ENCOUNTER — Ambulatory Visit: Payer: BC Managed Care – PPO | Admitting: Cardiovascular Disease

## 2023-07-25 ENCOUNTER — Other Ambulatory Visit: Payer: Self-pay | Admitting: Family Medicine

## 2023-07-25 DIAGNOSIS — I10 Essential (primary) hypertension: Secondary | ICD-10-CM

## 2023-07-26 NOTE — Telephone Encounter (Signed)
 Requested Prescriptions  Refused Prescriptions Disp Refills   losartan (COZAAR) 100 MG tablet [Pharmacy Med Name: LOSARTAN 100MG  TABLETS] 90 tablet 4    Sig: TAKE 1 TABLET(100 MG) BY MOUTH DAILY     Cardiovascular:  Angiotensin Receptor Blockers Failed - 07/26/2023  3:59 PM      Failed - Valid encounter within last 6 months    Recent Outpatient Visits           1 month ago Left hand pain   [facility] Amadeo June, MD   3 months ago Chest pain, unspecified type   [facility] Austine Lefort, MD   3 months ago Essential hypertension   [facility] Austine Lefort, MD   1 year ago Hypogonadism in male   [facility] Pickard, Cisco Crest, MD              Passed - Cr in normal range and within 180 days    Creat  Date Value Ref Range Status  04/15/2023 0.94 0.70 - 1.30 mg/dL Final   Creatinine, Ser  Date Value Ref Range Status  04/18/2023 0.84 0.61 - 1.24 mg/dL Final   Creatinine, Urine  Date Value Ref Range Status  09/19/2020 71 20 - 320 mg/dL Final         Passed - K in normal range and within 180 days    Potassium  Date Value Ref Range Status  04/18/2023 3.8 3.5 - 5.1 mmol/L Final         Passed - Patient is not pregnant      Passed - Last BP in normal range    BP Readings from Last 1 Encounters:  06/15/23 122/78

## 2024-03-16 ENCOUNTER — Telehealth: Payer: Self-pay

## 2024-03-16 NOTE — Telephone Encounter (Signed)
 Copied from CRM #[id number]. Topic: Clinical - Request for Lab/Test Order >> Mar 16, 2024  3:16 PM Charlet HERO wrote: Reason for CRM: Patient requesting to have labs done for his physical 01/13 please call the patient back to schedule a week before.

## 2024-04-14 ENCOUNTER — Other Ambulatory Visit: Payer: Self-pay | Admitting: Family Medicine

## 2024-04-17 ENCOUNTER — Other Ambulatory Visit

## 2024-04-17 DIAGNOSIS — Z13220 Encounter for screening for lipoid disorders: Secondary | ICD-10-CM

## 2024-04-17 DIAGNOSIS — R7303 Prediabetes: Secondary | ICD-10-CM

## 2024-04-17 DIAGNOSIS — G629 Polyneuropathy, unspecified: Secondary | ICD-10-CM

## 2024-04-17 DIAGNOSIS — Z0000 Encounter for general adult medical examination without abnormal findings: Secondary | ICD-10-CM

## 2024-04-17 DIAGNOSIS — I10 Essential (primary) hypertension: Secondary | ICD-10-CM

## 2024-04-17 DIAGNOSIS — E291 Testicular hypofunction: Secondary | ICD-10-CM

## 2024-04-18 LAB — COMPLETE METABOLIC PANEL WITHOUT GFR
AG Ratio: 2 (calc) (ref 1.0–2.5)
ALT: 34 U/L (ref 9–46)
AST: 19 U/L (ref 10–35)
Albumin: 4.8 g/dL (ref 3.6–5.1)
Alkaline phosphatase (APISO): 79 U/L (ref 35–144)
BUN: 21 mg/dL (ref 7–25)
CO2: 30 mmol/L (ref 20–32)
Calcium: 9.9 mg/dL (ref 8.6–10.3)
Chloride: 100 mmol/L (ref 98–110)
Creat: 0.94 mg/dL (ref 0.70–1.30)
Globulin: 2.4 g/dL (ref 1.9–3.7)
Glucose, Bld: 136 mg/dL — ABNORMAL HIGH (ref 65–99)
Potassium: 4.2 mmol/L (ref 3.5–5.3)
Sodium: 140 mmol/L (ref 135–146)
Total Bilirubin: 0.8 mg/dL (ref 0.2–1.2)
Total Protein: 7.2 g/dL (ref 6.1–8.1)

## 2024-04-18 LAB — HEMOGLOBIN A1C
Hgb A1c MFr Bld: 6.2 % — ABNORMAL HIGH
Mean Plasma Glucose: 131 mg/dL
eAG (mmol/L): 7.3 mmol/L

## 2024-04-18 LAB — LIPID PANEL
Cholesterol: 95 mg/dL
HDL: 54 mg/dL
LDL Cholesterol (Calc): 26 mg/dL
Non-HDL Cholesterol (Calc): 41 mg/dL
Total CHOL/HDL Ratio: 1.8 (calc)
Triglycerides: 74 mg/dL

## 2024-04-18 LAB — TESTOSTERONE: Testosterone: 178 ng/dL — ABNORMAL LOW (ref 250–827)

## 2024-04-18 LAB — CBC WITH DIFFERENTIAL/PLATELET
Absolute Lymphocytes: 2627 {cells}/uL (ref 850–3900)
Absolute Monocytes: 981 {cells}/uL — ABNORMAL HIGH (ref 200–950)
Basophils Absolute: 44 {cells}/uL (ref 0–200)
Basophils Relative: 0.4 %
Eosinophils Absolute: 207 {cells}/uL (ref 15–500)
Eosinophils Relative: 1.9 %
HCT: 44.5 % (ref 39.4–51.1)
Hemoglobin: 15.3 g/dL (ref 13.2–17.1)
MCH: 31.4 pg (ref 27.0–33.0)
MCHC: 34.4 g/dL (ref 31.6–35.4)
MCV: 91.4 fL (ref 81.4–101.7)
MPV: 9.7 fL (ref 7.5–12.5)
Monocytes Relative: 9 %
Neutro Abs: 7041 {cells}/uL (ref 1500–7800)
Neutrophils Relative %: 64.6 %
Platelets: 286 Thousand/uL (ref 140–400)
RBC: 4.87 Million/uL (ref 4.20–5.80)
RDW: 12.2 % (ref 11.0–15.0)
Total Lymphocyte: 24.1 %
WBC: 10.9 Thousand/uL — ABNORMAL HIGH (ref 3.8–10.8)

## 2024-04-19 ENCOUNTER — Ambulatory Visit: Payer: Self-pay | Admitting: Family Medicine

## 2024-04-23 ENCOUNTER — Other Ambulatory Visit: Payer: Self-pay | Admitting: Family Medicine

## 2024-04-23 DIAGNOSIS — I10 Essential (primary) hypertension: Secondary | ICD-10-CM

## 2024-04-24 ENCOUNTER — Ambulatory Visit: Admitting: Family Medicine

## 2024-04-24 ENCOUNTER — Encounter: Payer: Self-pay | Admitting: Family Medicine

## 2024-04-24 VITALS — BP 136/84 | HR 88 | Temp 98.3°F | Ht 65.0 in | Wt 190.0 lb

## 2024-04-24 DIAGNOSIS — R7303 Prediabetes: Secondary | ICD-10-CM | POA: Diagnosis not present

## 2024-04-24 DIAGNOSIS — I10 Essential (primary) hypertension: Secondary | ICD-10-CM

## 2024-04-24 DIAGNOSIS — Z0001 Encounter for general adult medical examination with abnormal findings: Secondary | ICD-10-CM | POA: Diagnosis not present

## 2024-04-24 DIAGNOSIS — Z125 Encounter for screening for malignant neoplasm of prostate: Secondary | ICD-10-CM

## 2024-04-24 DIAGNOSIS — R509 Fever, unspecified: Secondary | ICD-10-CM | POA: Diagnosis not present

## 2024-04-24 DIAGNOSIS — Z0000 Encounter for general adult medical examination without abnormal findings: Secondary | ICD-10-CM

## 2024-04-24 DIAGNOSIS — Z23 Encounter for immunization: Secondary | ICD-10-CM

## 2024-04-24 DIAGNOSIS — E291 Testicular hypofunction: Secondary | ICD-10-CM

## 2024-04-24 LAB — INFLUENZA A AND B AG, IMMUNOASSAY
INFLUENZA A ANTIGEN: NOT DETECTED
INFLUENZA B ANTIGEN: NOT DETECTED

## 2024-04-24 NOTE — Progress Notes (Signed)
 "  Subjective:    Patient ID: Nathaniel Parrish, male    DOB: 04-Feb-1971, 54 y.o.   MRN: [id number]  Patient is here today for complete physical exam.  Blood pressure is excellent at 136/84.  He denies any chest pain shortness of breath or dyspnea on exertion.  His most recent lab work as listed below and aside from a mild elevation in his white blood cell count his lab work shows stable prediabetes and is otherwise normal.  Patient did have a fever right before they checked his white blood cell count so he would like to repeat that again.  He is due for a PSA to screen for prostate cancer.  His last colonoscopy was in 2022.  He is due again in 7 years due to the presence of a tubular adenoma.  He is due for a flu shot today, shingles shot, and the pneumonia vaccine. Office Visit on 04/24/2024  Component Date Value Ref Range Status   Source: 04/24/2024 NASAL   Final   INFLUENZA A ANTIGEN 04/24/2024 NOT DETECTED  NOT DETECTED Final   INFLUENZA B ANTIGEN 04/24/2024 NOT DETECTED  NOT DETECTED Final   Comment: The sensitivity of this direct antigen immunoassay for influenza A and B is poor when compared to molecular or culture methods. Therefore, a negative result does not exclude influenza virus infection. If clinically indicated, consider ordering Culture, Influenza A and B, Rapid Method or Influenza virus A/B RNA, QL Real Time RT PCR.   Lab on 04/17/2024  Component Date Value Ref Range Status   WBC 04/17/2024 10.9 (H)  3.8 - 10.8 Thousand/uL Final   RBC 04/17/2024 4.87  4.20 - 5.80 Million/uL Final   Hemoglobin 04/17/2024 15.3  13.2 - 17.1 g/dL Final   HCT 98/93/7973 44.5  39.4 - 51.1 % Final   MCV 04/17/2024 91.4  81.4 - 101.7 fL Final   MCH 04/17/2024 31.4  27.0 - 33.0 pg Final   MCHC 04/17/2024 34.4  31.6 - 35.4 g/dL Final   RDW 98/93/7973 12.2  11.0 - 15.0 % Final   Platelets 04/17/2024 286  140 - 400 Thousand/uL Final   MPV 04/17/2024 9.7  7.5 - 12.5 fL Final   Neutro Abs 04/17/2024  7,041  1,500 - 7,800 cells/uL Final   Absolute Lymphocytes 04/17/2024 2,627  850 - 3,900 cells/uL Final   Absolute Monocytes 04/17/2024 981 (H)  200 - 950 cells/uL Final   Eosinophils Absolute 04/17/2024 207  15 - 500 cells/uL Final   Basophils Absolute 04/17/2024 44  0 - 200 cells/uL Final   Neutrophils Relative % 04/17/2024 64.6  % Final   Total Lymphocyte 04/17/2024 24.1  % Final   Monocytes Relative 04/17/2024 9.0  % Final   Eosinophils Relative 04/17/2024 1.9  % Final   Basophils Relative 04/17/2024 0.4  % Final   Glucose, Bld 04/17/2024 136 (H)  65 - 99 mg/dL Final   Comment: .            Fasting reference interval . For someone without known diabetes, a glucose value >125 mg/dL indicates that they may have diabetes and this should be confirmed with a follow-up test. .    BUN 04/17/2024 21  7 - 25 mg/dL Final   Creat 98/93/7973 0.94  0.70 - 1.30 mg/dL Final   BUN/Creatinine Ratio 04/17/2024 SEE NOTE:  6 - 22 (calc) Final   Comment:    Not Reported: BUN and Creatinine are within    reference range. SABRA  Sodium 04/17/2024 140  135 - 146 mmol/L Final   Potassium 04/17/2024 4.2  3.5 - 5.3 mmol/L Final   Chloride 04/17/2024 100  98 - 110 mmol/L Final   CO2 04/17/2024 30  20 - 32 mmol/L Final   Calcium  04/17/2024 9.9  8.6 - 10.3 mg/dL Final   Total Protein 98/93/7973 7.2  6.1 - 8.1 g/dL Final   Albumin 98/93/7973 4.8  3.6 - 5.1 g/dL Final   Globulin 98/93/7973 2.4  1.9 - 3.7 g/dL (calc) Final   AG Ratio 04/17/2024 2.0  1.0 - 2.5 (calc) Final   Total Bilirubin 04/17/2024 0.8  0.2 - 1.2 mg/dL Final   Alkaline phosphatase (APISO) 04/17/2024 79  35 - 144 U/L Final   AST 04/17/2024 19  10 - 35 U/L Final   ALT 04/17/2024 34  9 - 46 U/L Final   Cholesterol 04/17/2024 95  <200 mg/dL Final   HDL 98/93/7973 54  > OR = 40 mg/dL Final   Triglycerides 98/93/7973 74  <150 mg/dL Final   LDL Cholesterol (Calc) 04/17/2024 26  mg/dL (calc) Final   Comment: Reference range:  <100 . Desirable range <100 mg/dL for primary prevention;   <70 mg/dL for patients with CHD or diabetic patients  with > or = 2 CHD risk factors. SABRA LDL-C is now calculated using the Martin-Hopkins  calculation, which is a validated novel method providing  better accuracy than the Friedewald equation in the  estimation of LDL-C.  Gladis APPLETHWAITE et al. SANDREA. 7986;689(80): 2061-2068  (http://education.[facility].com/faq/FAQ164)    Total CHOL/HDL Ratio 04/17/2024 1.8  <4.9 (calc) Final   Non-HDL Cholesterol (Calc) 04/17/2024 41  <130 mg/dL (calc) Final   Comment: For patients with diabetes plus 1 major ASCVD risk  factor, treating to a non-HDL-C goal of <100 mg/dL  (LDL-C of <29 mg/dL) is considered a therapeutic  option.    Testosterone  04/17/2024 178 (L)  250 - 827 ng/dL Final   Comment: In hypogonadal males, Testosterone , Total, LC/MS/MS, is the recommended assay due to the diminished accuracy of immunoassay at levels below 250 ng/dL. This test code (84016) must be collected in a red-top tube with no gel.     Hgb A1c MFr Bld 04/17/2024 6.2 (H)  <5.7 % Final   Comment: For someone without known diabetes, a hemoglobin  A1c value between 5.7% and 6.4% is consistent with prediabetes and should be confirmed with a  follow-up test. . For someone with known diabetes, a value <7% indicates that their diabetes is well controlled. A1c targets should be individualized based on duration of diabetes, age, comorbid conditions, and other considerations. . This assay result is consistent with an increased risk of diabetes. . Currently, no consensus exists regarding use of hemoglobin A1c for diagnosis of diabetes for children. .    Mean Plasma Glucose 04/17/2024 131  mg/dL Final   eAG (mmol/L) 98/93/7973 7.3  mmol/L Final    Past Medical History:  Diagnosis Date   Hypertension    Hypogonadism in male    Low testosterone     Prediabetes    Past Surgical History:  Procedure  Laterality Date   NASAL SEPTUM SURGERY     Current Outpatient Medications on File Prior to Visit  Medication Sig Dispense Refill   hydrochlorothiazide  (HYDRODIURIL ) 25 MG tablet TAKE 1 TABLET(25 MG) BY MOUTH DAILY 30 tablet 1   losartan  (COZAAR ) 100 MG tablet TAKE 1 TABLET(100 MG) BY MOUTH DAILY 90 tablet 4   Multiple Vitamins-Minerals (MULTIVITAMIN MEN) TABS Take  1 tablet by mouth daily.     nitroGLYCERIN  (NITROSTAT ) 0.4 MG SL tablet Place 1 tablet (0.4 mg total) under the tongue every 5 (five) minutes as needed for chest pain. 50 tablet 3   Omega-3 Fatty Acids (FISH OIL PO) Take 1 capsule by mouth daily.     omeprazole (PRILOSEC OTC) 20 MG tablet Take 20 mg by mouth daily.     POTASSIUM PO Take 1 tablet by mouth daily. OTC     rosuvastatin  (CRESTOR ) 20 MG tablet TAKE 1 TABLET(20 MG) BY MOUTH DAILY 90 tablet 3   sildenafil  (VIAGRA ) 100 MG tablet Take 1 tablet (100 mg total) by mouth as needed for erectile dysfunction. 90 tablet 4   predniSONE  (STERAPRED UNI-PAK 21 TAB) 10 MG (21) TBPK tablet Use as directed. (Patient not taking: Reported on 04/24/2024) 21 each 0   No current facility-administered medications on file prior to visit.   No Known Allergies Social History   Socioeconomic History   Marital status: Married    Spouse name: Not on file   Number of children: Not on file   Years of education: Not on file   Highest education level: 12th grade  Occupational History   Not on file  Tobacco Use   Smoking status: Never   Smokeless tobacco: Current    Types: Chew  Substance and Sexual Activity   Alcohol use: No   Drug use: No   Sexual activity: Yes    Comment: married  Other Topics Concern   Not on file  Social History Narrative   Not on file   Social Drivers of Health   Tobacco Use: High Risk (04/24/2024)   Patient History    Smoking Tobacco Use: Never    Smokeless Tobacco Use: Current    Passive Exposure: Not on file  Financial Resource Strain: Low Risk (04/20/2024)    Overall Financial Resource Strain (CARDIA)    Difficulty of Paying Living Expenses: Not hard at all  Food Insecurity: No Food Insecurity (04/20/2024)   Epic    Worried About Radiation Protection Practitioner of Food in the Last Year: Never true    Ran Out of Food in the Last Year: Never true  Transportation Needs: No Transportation Needs (04/20/2024)   Epic    Lack of Transportation (Medical): No    Lack of Transportation (Non-Medical): No  Physical Activity: Unknown (04/20/2024)   Exercise Vital Sign    Days of Exercise per Week: Patient declined    Minutes of Exercise per Session: Not on file  Stress: No Stress Concern Present (04/20/2024)   [employer] of Occupational Health - Occupational Stress Questionnaire    Feeling of Stress: Only a little  Social Connections: Socially Integrated (04/20/2024)   Social Connection and Isolation Panel    Frequency of Communication with Friends and Family: Three times a week    Frequency of Social Gatherings with Friends and Family: Once a week    Attends Religious Services: More than 4 times per year    Active Member of Clubs or Organizations: Yes    Attends Banker Meetings: More than 4 times per year    Marital Status: Married  Catering Manager Violence: Not on file  Depression (PHQ2-9): Low Risk (04/24/2024)   Depression (PHQ2-9)    PHQ-2 Score: 0  Alcohol Screen: Low Risk (04/20/2024)   Alcohol Screen    Last Alcohol Screening Score (AUDIT): 2  Housing: Low Risk (04/20/2024)   Epic    Unable to Pay for  Housing in the Last Year: No    Number of Times Moved in the Last Year: 0    Homeless in the Last Year: No  Utilities: Not on file  Health Literacy: Not on file     Review of Systems  All other systems reviewed and are negative.      Objective:   Physical Exam Vitals reviewed.  Constitutional:      General: He is not in acute distress.    Appearance: He is well-developed. He is not diaphoretic.  HENT:     Head: Normocephalic and  atraumatic.     Right Ear: External ear normal.     Left Ear: External ear normal.     Nose: Nose normal.     Mouth/Throat:     Pharynx: No oropharyngeal exudate.  Eyes:     General: No scleral icterus.       Right eye: No discharge.        Left eye: No discharge.     Conjunctiva/sclera: Conjunctivae normal.     Pupils: Pupils are equal, round, and reactive to light.  Neck:     Thyroid: No thyromegaly.     Vascular: No JVD.  Cardiovascular:     Rate and Rhythm: Normal rate and regular rhythm.     Heart sounds: Normal heart sounds. No murmur heard. Pulmonary:     Effort: Pulmonary effort is normal. No respiratory distress.     Breath sounds: Normal breath sounds. No wheezing or rales.  Chest:     Chest wall: No tenderness.  Abdominal:     General: Bowel sounds are normal. There is no distension.     Palpations: Abdomen is soft. There is no mass.     Tenderness: There is no abdominal tenderness. There is no guarding or rebound.  Musculoskeletal:     Cervical back: Neck supple.  Lymphadenopathy:     Cervical: No cervical adenopathy.  Skin:    General: Skin is warm.     Findings: Rash present.  Neurological:     Mental Status: He is alert and oriented to person, place, and time.     Cranial Nerves: No cranial nerve deficit.     Motor: No abnormal muscle tone.     Coordination: Coordination normal.     Deep Tendon Reflexes: Reflexes normal.  Psychiatric:        Behavior: Behavior normal.        Thought Content: Thought content normal.        Judgment: Judgment normal.   Patient has numerous actinic keratoses on his scalp and forehead.  He sees dermatology every 6 months.        Assessment & Plan:  Fever, unspecified fever cause - Plan: Influenza A and B Ag, Immunoassay, CBC with Differential/Platelet  Prostate cancer screening - Plan: PSA  Need for shingles vaccine - Plan: Varicella-zoster vaccine IM  Flu vaccine need - Plan: Flu vaccine trivalent PF, 6mos and  older(Flulaval,Afluria,Fluarix,Fluzone)  General medical exam  Prediabetes  Hypogonadism in male  Essential hypertension Lab work looks excellent.  Patient received his flu shot and his shingles vaccine today.  Colonoscopy is up-to-date.  I will check a PSA.  If his PSA is stable or slightly improved, we can consider resuming testosterone .  His testosterone  remains extremely low.  Hemoglobin A1c is stable.  Blood pressure is excellent.  LDL cholesterol is well below his goal of 70 due to his history of coronary artery calcifications on CT scan "

## 2024-04-25 LAB — CBC WITH DIFFERENTIAL/PLATELET
Absolute Lymphocytes: 2905 {cells}/uL (ref 850–3900)
Absolute Monocytes: 745 {cells}/uL (ref 200–950)
Basophils Absolute: 43 {cells}/uL (ref 0–200)
Basophils Relative: 0.4 %
Eosinophils Absolute: 216 {cells}/uL (ref 15–500)
Eosinophils Relative: 2 %
HCT: 44.6 % (ref 39.4–51.1)
Hemoglobin: 14.9 g/dL (ref 13.2–17.1)
MCH: 29.7 pg (ref 27.0–33.0)
MCHC: 33.4 g/dL (ref 31.6–35.4)
MCV: 89 fL (ref 81.4–101.7)
MPV: 9.7 fL (ref 7.5–12.5)
Monocytes Relative: 6.9 %
Neutro Abs: 6890 {cells}/uL (ref 1500–7800)
Neutrophils Relative %: 63.8 %
Platelets: 314 Thousand/uL (ref 140–400)
RBC: 5.01 Million/uL (ref 4.20–5.80)
RDW: 12 % (ref 11.0–15.0)
Total Lymphocyte: 26.9 %
WBC: 10.8 Thousand/uL (ref 3.8–10.8)

## 2024-04-25 LAB — PSA: PSA: 2.61 ng/mL

## 2024-04-26 ENCOUNTER — Ambulatory Visit: Payer: Self-pay | Admitting: Family Medicine

## 2024-06-22 ENCOUNTER — Ambulatory Visit

## 2025-04-18 ENCOUNTER — Other Ambulatory Visit

## 2025-04-25 ENCOUNTER — Encounter: Admitting: Family Medicine
